# Patient Record
Sex: Female | Born: 1990 | Race: Black or African American | Hispanic: No | Marital: Married | Smoking: Current some day smoker
Health system: Southern US, Community
[De-identification: ages and names within clinical notes are randomized; demographics above are authoritative.]

## PROBLEM LIST (undated history)

## (undated) DIAGNOSIS — A749 Chlamydial infection, unspecified: Secondary | ICD-10-CM

## (undated) DIAGNOSIS — E282 Polycystic ovarian syndrome: Secondary | ICD-10-CM

## (undated) DIAGNOSIS — F419 Anxiety disorder, unspecified: Secondary | ICD-10-CM

## (undated) DIAGNOSIS — IMO0002 Reserved for concepts with insufficient information to code with codable children: Secondary | ICD-10-CM

## (undated) DIAGNOSIS — N871 Moderate cervical dysplasia: Secondary | ICD-10-CM

## (undated) DIAGNOSIS — F1910 Other psychoactive substance abuse, uncomplicated: Secondary | ICD-10-CM

## (undated) DIAGNOSIS — N979 Female infertility, unspecified: Secondary | ICD-10-CM

## (undated) HISTORY — PX: CERVICAL BIOPSY  W/ LOOP ELECTRODE EXCISION: SUR135

## (undated) HISTORY — PX: FINGER SURGERY: SHX640

## (undated) HISTORY — DX: Female infertility, unspecified: N97.9

## (undated) HISTORY — DX: Other psychoactive substance abuse, uncomplicated: F19.10

## (undated) HISTORY — DX: Moderate cervical dysplasia: N87.1

## (undated) HISTORY — DX: Reserved for concepts with insufficient information to code with codable children: IMO0002

## (undated) HISTORY — DX: Polycystic ovarian syndrome: E28.2

## (undated) HISTORY — DX: Anxiety disorder, unspecified: F41.9

## (undated) HISTORY — PX: THERAPEUTIC ABORTION: SHX798

## (undated) HISTORY — DX: Chlamydial infection, unspecified: A74.9

---
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
## 2009-01-04 ENCOUNTER — Ambulatory Visit: Payer: Self-pay | Admitting: Gynecology

## 2009-12-28 ENCOUNTER — Ambulatory Visit: Payer: Self-pay | Admitting: Gynecology

## 2009-12-28 ENCOUNTER — Other Ambulatory Visit: Admission: RE | Admit: 2009-12-28 | Discharge: 2009-12-28 | Payer: Self-pay | Admitting: Gynecology

## 2010-01-25 ENCOUNTER — Ambulatory Visit: Payer: Self-pay | Admitting: Gynecology

## 2010-02-22 ENCOUNTER — Ambulatory Visit: Payer: Self-pay | Admitting: Gynecology

## 2010-03-13 ENCOUNTER — Ambulatory Visit: Payer: Self-pay | Admitting: Gynecology

## 2010-03-16 ENCOUNTER — Ambulatory Visit: Payer: Self-pay | Admitting: Gynecology

## 2010-03-16 ENCOUNTER — Ambulatory Visit (HOSPITAL_BASED_OUTPATIENT_CLINIC_OR_DEPARTMENT_OTHER): Admission: RE | Admit: 2010-03-16 | Discharge: 2010-03-16 | Payer: Self-pay | Admitting: Gynecology

## 2010-03-30 ENCOUNTER — Ambulatory Visit: Payer: Self-pay | Admitting: Gynecology

## 2010-09-22 ENCOUNTER — Emergency Department (HOSPITAL_COMMUNITY)
Admission: EM | Admit: 2010-09-22 | Discharge: 2010-09-22 | Disposition: A | Discharge status: Home / Self Care | Payer: Self-pay | Attending: Emergency Medicine | Admitting: Emergency Medicine

## 2010-09-22 DIAGNOSIS — R109 Unspecified abdominal pain: Secondary | ICD-10-CM | POA: Insufficient documentation

## 2010-09-22 DIAGNOSIS — N390 Urinary tract infection, site not specified: Secondary | ICD-10-CM | POA: Insufficient documentation

## 2010-09-22 DIAGNOSIS — R300 Dysuria: Secondary | ICD-10-CM | POA: Insufficient documentation

## 2010-09-22 LAB — URINALYSIS, ROUTINE W REFLEX MICROSCOPIC
Bilirubin Urine: NEGATIVE
Nitrite: NEGATIVE
Protein, ur: >300 mg/dL — AB
Specific Gravity, Urine: 1.03 (ref 1.005–1.030)
Urine Glucose, Fasting: NEGATIVE mg/dL
Urobilinogen, UA: 1 mg/dL (ref 0.0–1.0)
pH: 7 (ref 5.0–8.0)

## 2010-09-22 LAB — URINE MICROSCOPIC-ADD ON

## 2010-09-22 LAB — POCT PREGNANCY, URINE: Preg Test, Ur: NEGATIVE

## 2011-03-20 ENCOUNTER — Ambulatory Visit (INDEPENDENT_AMBULATORY_CARE_PROVIDER_SITE_OTHER): Payer: BC Managed Care – PPO | Admitting: Women's Health

## 2011-03-20 VITALS — BP 110/64

## 2011-03-20 DIAGNOSIS — Z23 Encounter for immunization: Secondary | ICD-10-CM

## 2011-03-20 DIAGNOSIS — B373 Candidiasis of vulva and vagina: Secondary | ICD-10-CM

## 2011-03-20 DIAGNOSIS — A599 Trichomoniasis, unspecified: Secondary | ICD-10-CM

## 2011-03-20 DIAGNOSIS — N898 Other specified noninflammatory disorders of vagina: Secondary | ICD-10-CM

## 2011-03-20 DIAGNOSIS — Z113 Encounter for screening for infections with a predominantly sexual mode of transmission: Secondary | ICD-10-CM

## 2011-03-20 MED ORDER — TINIDAZOLE 500 MG PO TABS: 2.0000 g | ORAL_TABLET | Freq: Once | ORAL | Status: AC

## 2011-03-20 MED ORDER — FLUCONAZOLE 150 MG PO TABS: 150.0000 mg | ORAL_TABLET | Freq: Once | ORAL | Status: AC

## 2011-03-20 NOTE — Progress Notes (Signed)
  Presents with a complaint of vaginal discharge with burning and itching. Used over-the-counter Monistat without relief. She is due for an annual exam and instructed her to schedule with Dr. Lily Peer. She has had a breakup with her boyfriend, states he  probably was unfaithful. She does use condoms for contraception. Other contraception was reviewed declines at this time will continue with condoms.  External genitalia is erythematous with a milky discharge. Speculum exam moderate amount of  White  adherent discharge with odor was noted, vaginal walls were also erythematous. GC chlamydia culture was taken and is pending. Wet prep was taken positive for yeast and Trichomonas. Bimanual no adnexal fullness or tenderness, discomfort only in vaginal area.  Will treat with Tindamax 2 g by mouth x1 dose, alcohol precautions were reviewed. Diflucan 150 by mouth x1 dose with a refill was given. Instructed to inform her ex partner so that he can be treated as well. Reviewed importance of condoms for infection control. Reviewed will check an HIV hepatitis and RPR at her annual exam.  Gardasil information was reviewed. The  first given today,  return in 2 months and 4 months,  reviewed it is a 3 series vaccination.

## 2011-03-21 ENCOUNTER — Telehealth: Payer: Self-pay | Admitting: Women's Health

## 2011-03-21 DIAGNOSIS — A749 Chlamydial infection, unspecified: Secondary | ICD-10-CM

## 2011-03-21 MED ORDER — AZITHROMYCIN 500 MG PO TABS: 1000.0000 mg | ORAL_TABLET | Freq: Every day | ORAL | Status: AC

## 2011-03-21 NOTE — Telephone Encounter (Signed)
Left message on cell to call office.

## 2011-03-21 NOTE — Telephone Encounter (Signed)
Telephone call to inform positive Chlamydia on culture. Instructed to inform partner for treatment, abstain, will treat with Zithromax 1 g for 1 dose. Will call into her pharmacy, instructed to schedule a three-week followup and is dunual exam also.e for her annual.

## 2011-03-25 DIAGNOSIS — N871 Moderate cervical dysplasia: Secondary | ICD-10-CM | POA: Insufficient documentation

## 2011-03-25 DIAGNOSIS — IMO0002 Reserved for concepts with insufficient information to code with codable children: Secondary | ICD-10-CM | POA: Insufficient documentation

## 2011-03-27 ENCOUNTER — Encounter: Payer: BC Managed Care – PPO | Admitting: Women's Health

## 2011-04-03 ENCOUNTER — Encounter: Payer: BC Managed Care – PPO | Admitting: Women's Health

## 2011-04-11 ENCOUNTER — Encounter: Payer: Self-pay | Admitting: Women's Health

## 2011-04-11 ENCOUNTER — Ambulatory Visit (INDEPENDENT_AMBULATORY_CARE_PROVIDER_SITE_OTHER): Payer: BC Managed Care – PPO | Admitting: Women's Health

## 2011-04-11 VITALS — BP 110/70

## 2011-04-11 DIAGNOSIS — N898 Other specified noninflammatory disorders of vagina: Secondary | ICD-10-CM

## 2011-04-11 DIAGNOSIS — B373 Candidiasis of vulva and vagina: Secondary | ICD-10-CM

## 2011-04-11 DIAGNOSIS — N926 Irregular menstruation, unspecified: Secondary | ICD-10-CM

## 2011-04-11 LAB — POCT URINE PREGNANCY: Preg Test, Ur: POSITIVE

## 2011-04-11 MED ORDER — FLUCONAZOLE 150 MG PO TABS: 150.0000 mg | ORAL_TABLET | Freq: Once | ORAL | Status: AC

## 2011-04-11 MED ORDER — AZITHROMYCIN 1 G PO PACK: 1.0000 | PACK | Freq: Once | ORAL | Status: AC

## 2011-04-11 NOTE — Progress Notes (Signed)
  Presents with a positive U PT, LMP 02/09/2011, which puts her at 8-5/[redacted] weeks gestation. She is not pleased with the pregnancy, is planning to terminate. She's had no bleeding or cramping. She had positive Chlamydia and trichomonas at  August 15 appointment. She took the Flagyl, but did not take the Zithromax. Did review importance of taking the Zithromax, sent the order back to the pharmacy to make sure that would be there for her to take.  Also states is having some increased discharge. External genitalia is slightly erythematous, speculum exam moderate amount of white discharge was noted no odor. Wet prep is positive for yeast. Bimanual uterus palpates about 8 weeks size, no adnexal tenderness, no CMT.  Plan; Diflucan 150 by mouth x1 dose, instructed to take the Zithromax, is planning to terminate, did review if she plans to continue the pregnancy to return to the office for a viability ultrasound. Instructed to return to the office in 3 weeks for an annual exam with Pap smear and test of cure Chlamydia.

## 2011-05-24 ENCOUNTER — Telehealth: Payer: Self-pay | Admitting: *Deleted

## 2011-05-24 NOTE — Telephone Encounter (Signed)
Pt called wanting refill on Diflucan and I left her a message on her VM stating no Rx over phone. Will need OV for AEX and TOC per [place]

## 2011-06-05 ENCOUNTER — Encounter: Payer: BC Managed Care – PPO | Admitting: Women's Health

## 2011-06-13 ENCOUNTER — Encounter: Payer: Self-pay | Admitting: Women's Health

## 2011-06-13 ENCOUNTER — Ambulatory Visit (INDEPENDENT_AMBULATORY_CARE_PROVIDER_SITE_OTHER): Payer: BC Managed Care – PPO | Admitting: Women's Health

## 2011-06-13 ENCOUNTER — Other Ambulatory Visit (HOSPITAL_COMMUNITY)
Admission: RE | Admit: 2011-06-13 | Discharge: 2011-06-13 | Disposition: A | Discharge status: Home / Self Care | Payer: BC Managed Care – PPO | Source: Ambulatory Visit | Attending: Women's Health | Admitting: Women's Health

## 2011-06-13 VITALS — BP 112/70 | Ht 62.0 in | Wt 172.0 lb

## 2011-06-13 DIAGNOSIS — Z01419 Encounter for gynecological examination (general) (routine) without abnormal findings: Secondary | ICD-10-CM

## 2011-06-13 DIAGNOSIS — N898 Other specified noninflammatory disorders of vagina: Secondary | ICD-10-CM

## 2011-06-13 DIAGNOSIS — Z23 Encounter for immunization: Secondary | ICD-10-CM

## 2011-06-13 DIAGNOSIS — Z113 Encounter for screening for infections with a predominantly sexual mode of transmission: Secondary | ICD-10-CM

## 2011-06-13 DIAGNOSIS — B373 Candidiasis of vulva and vagina: Secondary | ICD-10-CM

## 2011-06-13 LAB — HEPATITIS C ANTIBODY: HCV Ab: NEGATIVE

## 2011-06-13 LAB — HIV ANTIBODY (ROUTINE TESTING W REFLEX): HIV: NONREACTIVE

## 2011-06-13 MED ORDER — FLUCONAZOLE 150 MG PO TABS: 150.0000 mg | ORAL_TABLET | Freq: Once | ORAL | Status: AC

## 2011-06-13 MED ORDER — METRONIDAZOLE 0.75 % VA GEL: VAGINAL | Status: AC

## 2011-06-13 NOTE — Progress Notes (Signed)
MALYA CIRILLO Mar 04, 1991 [id number]    History:    The patient presents for annual exam.    Past medical history, past surgical history, family history and social history were all reviewed and documented in the EPIC chart.   ROS:  A  ROS was performed and pertinent positives and negatives are included in the history.  Exam:  Filed Vitals:   06/13/11 1048  BP: 112/70    General appearance:  Normal Head/Neck:  Normal, without cervical or supraclavicular adenopathy. Thyroid:  Symmetrical, normal in size, without palpable masses or nodularity. Respiratory  Effort:  Normal  Auscultation:  Clear without wheezing or rhonchi Cardiovascular  Auscultation:  Regular rate, without rubs, murmurs or gallops  Edema/varicosities:  Not grossly evident Abdominal  Soft,nontender, without masses, guarding or rebound.  Liver/spleen:  No organomegaly noted  Hernia:  None appreciated  Skin  Inspection:  Grossly normal  Palpation:  Grossly normal Neurologic/psychiatric  Orientation:  Normal with appropriate conversation.  Mood/affect:  Normal  Genitourinary    Breasts: Examined lying and sitting.     Right: Without masses, retractions, discharge or axillary adenopathy.     Left: Without masses, retractions, discharge or axillary adenopathy.   Inguinal/mons:  Normal without inguinal adenopathy  External genitalia:  Normal  BUS/Urethra/Skene's glands:  Normal  Bladder:  Normal  Vagina:  Normal  Cervix:  Normal  Uterus:   normal in size, shape and contour.  Midline and mobile  Adnexa/parametria:     Rt: Without masses or tenderness.   Lt: Without masses or tenderness.  Anus and perineum: Normal  Digital rectal exam: Normal sphincter tone without palpated masses or tenderness  Assessment/Plan:  20 y.o.SBF G1P0  for annual exam. Had a termination less than one month ago. Was positive for Chlamydia in August, did not take the Zithromax as prescribed, did take doxycycline with  termination.  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 Diflucan 150 by mouth x1 dose, prescription proper use reviewed for both prescriptions. Contraception reviewed. States feels like she is getting ready to start her cycle, has not been sexually active, options reviewed, will try nexplanon , Dr. Lily Peer to place with next cycle. Will start on Generess (sample pack  given) first day of next cycle take daily until Nexplanon can be placed. Encouraged abstinence,  condoms if active. Handout, information reviewed about nexplanon. SBEs, exercise, MVI daily encouraged. Dating safety reviewed, abstinence encouraged. CBC, Pap, GC/Chlamydia, HIV, RPR, hepatitis B and C.   Harrington Challenger [facility], 12:41 PM 06/13/2011

## 2011-06-24 ENCOUNTER — Encounter: Payer: Self-pay | Admitting: *Deleted

## 2011-06-24 NOTE — Progress Notes (Signed)
  Checked benefits for Nexplanon for patient.  Insurance claims her name is different from what they have in their system.  We can not perform procedure till this is situated.  Once it is settled she would have a $20 copay.  Patient is to work on getting this straight and then call us when done so we can schedule insert.

## 2011-09-23 ENCOUNTER — Ambulatory Visit: Payer: BC Managed Care – PPO

## 2011-10-25 ENCOUNTER — Other Ambulatory Visit: Payer: Self-pay | Admitting: Women's Health

## 2012-01-31 ENCOUNTER — Encounter: Payer: Self-pay | Admitting: Women's Health

## 2012-01-31 ENCOUNTER — Telehealth: Payer: Self-pay | Admitting: Women's Health

## 2012-01-31 ENCOUNTER — Ambulatory Visit (INDEPENDENT_AMBULATORY_CARE_PROVIDER_SITE_OTHER): Payer: BC Managed Care – PPO | Admitting: Women's Health

## 2012-01-31 DIAGNOSIS — N912 Amenorrhea, unspecified: Secondary | ICD-10-CM

## 2012-01-31 DIAGNOSIS — IMO0001 Reserved for inherently not codable concepts without codable children: Secondary | ICD-10-CM

## 2012-01-31 DIAGNOSIS — Z309 Encounter for contraceptive management, unspecified: Secondary | ICD-10-CM

## 2012-01-31 DIAGNOSIS — N898 Other specified noninflammatory disorders of vagina: Secondary | ICD-10-CM

## 2012-01-31 LAB — HCG, SERUM, QUALITATIVE: Preg, Serum: NEGATIVE

## 2012-01-31 LAB — WET PREP FOR TRICH, YEAST, CLUE: Yeast Wet Prep HPF POC: NONE SEEN

## 2012-01-31 MED ORDER — METRONIDAZOLE 0.75 % VA GEL: VAGINAL | Status: AC

## 2012-01-31 MED ORDER — FLUCONAZOLE 150 MG PO TABS: 150.0000 mg | ORAL_TABLET | Freq: Once | ORAL | Status: AC

## 2012-01-31 MED ORDER — ETONOGESTREL-ETHINYL ESTRADIOL 0.12-0.015 MG/24HR VA RING: 1.0000 | VAGINAL_RING | VAGINAL | Status: DC

## 2012-01-31 MED ORDER — MEDROXYPROGESTERONE ACETATE 10 MG PO TABS: 10.0000 mg | ORAL_TABLET | Freq: Every day | ORAL | Status: DC

## 2012-01-31 NOTE — Patient Instructions (Addendum)
Bacterial Vaginosis Bacterial vaginosis (BV) is a vaginal infection where the normal balance of bacteria in the vagina is disrupted. The normal balance is then replaced by an overgrowth of certain bacteria. There are several different kinds of bacteria that can cause BV. BV is the most common vaginal infection in women of childbearing age. CAUSES   The cause of BV is not fully understood. BV develops when there is an increase or imbalance of harmful bacteria.   Some activities or behaviors can upset the normal balance of bacteria in the vagina and put women at increased risk including:   Having a new sex partner or multiple sex partners.   Douching.   Using an intrauterine device (IUD) for contraception.   It is not clear what role sexual activity plays in the development of BV. However, women that have never had sexual intercourse are rarely infected with BV.  Women do not get BV from toilet seats, bedding, swimming pools or from touching objects around them.  SYMPTOMS   Grey vaginal discharge.   A fish-like odor with discharge, especially after sexual intercourse.   Itching or burning of the vagina and vulva.   Burning or pain with urination.   Some women have no signs or symptoms at all.  DIAGNOSIS  Your caregiver must examine the vagina for signs of BV. Your caregiver will perform lab tests and look at the sample of vaginal fluid through a microscope. They will look for bacteria and abnormal cells (clue cells), a pH test higher than 4.5, and a positive amine test all associated with BV.  RISKS AND COMPLICATIONS   Pelvic inflammatory disease (PID).   Infections following gynecology surgery.   Developing HIV.   Developing herpes virus.  TREATMENT  Sometimes BV will clear up without treatment. However, all women with symptoms of BV should be treated to avoid complications, especially if gynecology surgery is planned. Female partners generally do not need to be treated. However,  BV may spread between female sex partners so treatment is helpful in preventing a recurrence of BV.   BV may be treated with antibiotics. The antibiotics come in either pill or vaginal cream forms. Either can be used with nonpregnant or pregnant women, but the recommended dosages differ. These antibiotics are not harmful to the baby.   BV can recur after treatment. If this happens, a second round of antibiotics will often be prescribed.   Treatment is important for pregnant women. If not treated, BV can cause a premature delivery, especially for a pregnant woman who had a premature birth in the past. All pregnant women who have symptoms of BV should be checked and treated.   For chronic reoccurrence of BV, treatment with a type of prescribed gel vaginally twice a week is helpful.  HOME CARE INSTRUCTIONS   Finish all medication as directed by your caregiver.   Do not have sex until treatment is completed.   Tell your sexual partner that you have a vaginal infection. They should see their caregiver and be treated if they have problems, such as a mild rash or itching.   Practice safe sex. Use condoms. Only have 1 sex partner.  PREVENTION  Basic prevention steps can help reduce the risk of upsetting the natural balance of bacteria in the vagina and developing BV:  Do not have sexual intercourse (be abstinent).   Do not douche.   Use all of the medicine prescribed for treatment of BV, even if the signs and symptoms go away.     Tell your sex partner if you have BV. That way, they can be treated, if needed, to prevent reoccurrence.  SEEK MEDICAL CARE IF:   Your symptoms are not improving after 3 days of treatment.   You have increased discharge, pain, or fever.  MAKE SURE YOU:   Understand these instructions.   Will watch your condition.   Will get help right away if you are not doing well or get worse.  FOR MORE INFORMATION  Division of STD Prevention (DSTDP), Centers for Disease  Control and Prevention: www.cdc.gov/std American Social Health Association (ASHA): www.ashastd.org  Document Released: 07/22/2005 Document Revised: 07/11/2011 Document Reviewed: 01/12/2009 ExitCare Patient Information 2012 [employer]. 

## 2012-01-31 NOTE — Telephone Encounter (Signed)
Telephone call, informed blood pregnancy test result not back will call first thing Monday morning with results.

## 2012-01-31 NOTE — Progress Notes (Signed)
Presents for amenorrhea since beginning of April. Unsure of exact date of LMP. Monthly cycles prior to April.  UPT/STD testing 1 month ago-negative. Unprotected sex X 1 since negative UPT. Same partner X 1 year/occassional condoms/desiring nexplanon. Reports diagnosed with BV one week ago, has not taken prescription.   Exam: External genitalia erythematous. Speculum exam: White discharge adherent to vaginal walls/no odor noted.  Bimanual: No cervical motion or adnexal tenderness. Wet prep positive for amines, clues, and bacteria.  Amenorrhea BV Contraception counseling  Plan: hcG qual.  Provera 10 mg X 5 days, prescription, instructed not to take until notified of hCG results. Instructed to notify if no cycle in 2 weeks or if continued irregular cycles. Metrogel vaginal  gel1 applicator X 5 days, prescription, use reviewed. Nuva Ring sample and prescription, will tryl until nexplanon coverage obtained. Encouraged condoms for first month on Nuva Ring for pregnancy prevention and continued condoms for infection control. Instructed on slight risk for blood clots/strokes with Nuva ring. Instructed to call office with next cycle to schedule nexplanon insertion with Dr. Lily Peer.

## 2012-01-31 NOTE — Telephone Encounter (Signed)
Left message for patient to call me regarding ins benefits for Nexplanon.  Her plan will cover the device and insertion 100% and the removal with a $20 copymt and then 100%.

## 2012-01-31 NOTE — Telephone Encounter (Signed)
Patient called back. I let her know benefits. She is awaiting qualitative serum pregnancy test results. If negative and she wants to proceed I gave her my number to call me back and let me know so we can order her one and arrange.

## 2012-02-03 NOTE — Telephone Encounter (Signed)
Informed by telephone on 6/28 blood pregnancy test negative instructed to take Provera and call if no cycle in 2 weeks.

## 2012-02-04 NOTE — Telephone Encounter (Signed)
I called patient to follow-up and see if she wanted Amy to order Nexplanon.  She said she wants to think about it. I told her just to call when she is ready and we will order for her.

## 2012-02-18 ENCOUNTER — Telehealth: Payer: Self-pay | Admitting: *Deleted

## 2012-02-18 NOTE — Telephone Encounter (Signed)
(  pt aware you out of the office) Pt was given provera x 5 days to start cycle on 01/31/12, pt took all Rx and no cycle in 2 weeks as noted in office note. Pt is calling to follow up. Please advise

## 2012-02-19 NOTE — Telephone Encounter (Signed)
Please call patient--needs office visit.

## 2012-02-19 NOTE — Telephone Encounter (Signed)
Pt informed with the below note, pt said she will call back to make ov.

## 2012-02-20 ENCOUNTER — Ambulatory Visit (INDEPENDENT_AMBULATORY_CARE_PROVIDER_SITE_OTHER): Payer: BC Managed Care – PPO | Admitting: Women's Health

## 2012-02-20 ENCOUNTER — Encounter: Payer: Self-pay | Admitting: Women's Health

## 2012-02-20 DIAGNOSIS — B379 Candidiasis, unspecified: Secondary | ICD-10-CM

## 2012-02-20 DIAGNOSIS — B49 Unspecified mycosis: Secondary | ICD-10-CM

## 2012-02-20 DIAGNOSIS — N912 Amenorrhea, unspecified: Secondary | ICD-10-CM

## 2012-02-20 LAB — WET PREP FOR TRICH, YEAST, CLUE

## 2012-02-20 MED ORDER — FLUCONAZOLE 150 MG PO TABS: 150.0000 mg | ORAL_TABLET | Freq: Once | ORAL | Status: AC

## 2012-02-20 NOTE — Progress Notes (Signed)
Patient ID: Laura Huang, female   DOB: 1990/10/22, 22 y.o.   MRN: [id number] Presents with amenorrhea after Provera 10 mg for 5 days. Was seen in the office 2 weeks ago had a negative hCG qual. I was given Provera. Had monthly regular. Had been using condoms but not consistently.  Exam: Abdomen soft nontender external genitalia erythematous at introitus. Speculum exam moderate amount of a white discharge was noted wet prep was positive for rare yeast. Bimanual uterus small nontender no adnexal fullness or tenderness.  Secondary amenorrhea Yeast  Plan: TSH, prolactin, hCG qualitative. If all normal ultrasound. Diflucan 150 by mouth x1 dose.

## 2012-02-20 NOTE — Patient Instructions (Addendum)
Secondary Amenorrhea  Secondary amenorrhea is the stopping of menstrual flow for 3 to 6 months in a female who has previously had periods. There are many possible causes. Most of these causes are not serious. Usually treating the underlying problem causing the loss of menses will return your periods to normal. CAUSES  Some common and uncommon causes of not menstruating include:  Malnutrition.   Low blood sugar (hypoglycemia).   Polycystic ovarian disease.   Stress or fear.   Breastfeeding.   Hormone imbalance.   Ovarian failure.   Medications.   Extreme obesity.   Cystic fibrosis.   Low body weight or drastic weight reduction from any cause.   Early menopause.   Removal of ovaries or uterus.   Contraceptives.   Illness.   Long term (chronic) illnesses.   Cushing's syndrome.   Thyroid problems.   Birth control pills, patches, or vaginal rings for birth control.  DIAGNOSIS  This diagnosis is made by your caregiver taking a medical history and doing a physical exam. Pregnancy must be ruled out. Often times, numerous blood tests of different hormones in the body may be measured. Urine testing may be done. Specialized x-rays may have to be done as well as measuring the body mass index (BMI). TREATMENT  Treatment depends on the cause of the amenorrhea. If an eating disorder is present, this can be treated with an adequate diet and therapy. Chronic illnesses may improve with treatment of the illness. Overall, the outlook is good. The amenorrhea may be corrected with medications, lifestyle changes, or surgery. If the amenorrhea cannot be corrected, it is sometimes possible to create a false menstruation with medications. Document Released: 09/02/2006 Document Revised: 07/11/2011 Document Reviewed: 07/10/2007 ExitCare Patient Information 2012 [employer]. 

## 2012-02-21 LAB — HCG, QUANTITATIVE, PREGNANCY: hCG, Beta Chain, Quant, S: <1 m[IU]/mL

## 2012-02-27 ENCOUNTER — Other Ambulatory Visit: Payer: BC Managed Care – PPO

## 2012-02-27 ENCOUNTER — Ambulatory Visit: Payer: BC Managed Care – PPO | Admitting: Women's Health

## 2012-02-28 ENCOUNTER — Other Ambulatory Visit: Payer: BC Managed Care – PPO

## 2012-02-28 ENCOUNTER — Ambulatory Visit: Payer: BC Managed Care – PPO | Admitting: Women's Health

## 2012-03-02 ENCOUNTER — Other Ambulatory Visit: Payer: BC Managed Care – PPO

## 2012-03-02 ENCOUNTER — Ambulatory Visit: Payer: BC Managed Care – PPO | Admitting: Women's Health

## 2012-04-15 ENCOUNTER — Other Ambulatory Visit: Payer: Self-pay | Admitting: Women's Health

## 2012-06-11 ENCOUNTER — Ambulatory Visit: Payer: BC Managed Care – PPO | Admitting: Women's Health

## 2013-12-04 ENCOUNTER — Other Ambulatory Visit: Payer: Self-pay | Admitting: Women's Health

## 2014-01-04 ENCOUNTER — Encounter: Payer: Self-pay | Admitting: Women's Health

## 2014-01-04 ENCOUNTER — Ambulatory Visit (INDEPENDENT_AMBULATORY_CARE_PROVIDER_SITE_OTHER): Payer: BC Managed Care – PPO | Admitting: Women's Health

## 2014-01-04 DIAGNOSIS — N938 Other specified abnormal uterine and vaginal bleeding: Secondary | ICD-10-CM

## 2014-01-04 DIAGNOSIS — N925 Other specified irregular menstruation: Secondary | ICD-10-CM

## 2014-01-04 DIAGNOSIS — IMO0001 Reserved for inherently not codable concepts without codable children: Secondary | ICD-10-CM

## 2014-01-04 DIAGNOSIS — N949 Unspecified condition associated with female genital organs and menstrual cycle: Secondary | ICD-10-CM

## 2014-01-04 DIAGNOSIS — Z309 Encounter for contraceptive management, unspecified: Secondary | ICD-10-CM

## 2014-01-04 LAB — WET PREP FOR TRICH, YEAST, CLUE
Clue Cells Wet Prep HPF POC: NONE SEEN
Trich, Wet Prep: NONE SEEN
WBC, Wet Prep HPF POC: NONE SEEN
YEAST WET PREP: NONE SEEN

## 2014-01-04 MED ORDER — IBUPROFEN 600 MG PO TABS: 600.0000 mg | ORAL_TABLET | Freq: Three times a day (TID) | ORAL | Status: DC | PRN

## 2014-01-04 MED ORDER — MEDROXYPROGESTERONE ACETATE 10 MG PO TABS: 10.0000 mg | ORAL_TABLET | Freq: Every day | ORAL | Status: DC

## 2014-01-04 MED ORDER — NORGESTREL-ETHINYL ESTRADIOL 0.3-30 MG-MCG PO TABS: 1.0000 | ORAL_TABLET | Freq: Every day | ORAL | Status: DC

## 2014-01-04 NOTE — Progress Notes (Signed)
Patient ID: Laura Huang, female   DOB: 1990/11/14, 23 y.o.   MRN: [id number] Has not been seen in the office for 2 years, working in [place]. Presents with low abdominal pain, heavy menstrual cycle with cramping. Had been put on birth control pills for cycle regulation history of irregular cycles with periods of amenorrhea.  2 day cycle in January on pills, stopped pills, month of April bled daily, May 31 low abdominal cramping with heavy bleeding started. Using condoms and withdrawal or nothing for contraception. Same partner. Denies urinary symptoms, vaginal discharge.  Exam: Appears well. External genitalia within normal limits, speculum exam copious menstrual type blood wet prep negative, GC/Chlamydia culture taken, bimanual slight tenderness with exam uterus small.  Menorrhagia  Plan: HCG qualitative, TSH, prolactin. Discussed taking Provera 10 for 10 days if call negative and bleeding does not stop by 7 days. Reviewed starting back on pills, prescription, proper use, slight risk for blood clots and strokes reviewed. Condoms first month back on. Instructed to call if no relief, schedule annual exam.

## 2014-01-05 LAB — PROLACTIN: PROLACTIN: 9.8 ng/mL

## 2014-01-05 LAB — GC/CHLAMYDIA PROBE AMP
CT PROBE, AMP APTIMA: NEGATIVE
GC Probe RNA: NEGATIVE

## 2014-01-05 LAB — HCG, SERUM, QUALITATIVE: Preg, Serum: NEGATIVE

## 2014-01-05 LAB — TSH: TSH: 3.579 u[IU]/mL (ref 0.350–4.500)

## 2014-01-07 ENCOUNTER — Telehealth: Payer: Self-pay

## 2014-01-07 ENCOUNTER — Encounter: Payer: Self-pay | Admitting: Women's Health

## 2014-01-07 NOTE — Telephone Encounter (Signed)
Patient informed of below. Bleeding has already stopped. She asked when she should start the birth control pill Rx you gave her?

## 2014-01-07 NOTE — Telephone Encounter (Signed)
Message copied by Keenan Bachelor on Fri Jan 07, 2014  2:51 PM ------      Message from: [place], [place] J      Created: Wed Jan 05, 2014  3:47 PM       Please call and review all labs were normal, blood pregnancy test negative, if bleeding does not stop/slow by Saturday start Provera and call if that does not stop bleeding. ------

## 2014-01-07 NOTE — Telephone Encounter (Signed)
Start Sunday and take daily

## 2014-01-07 NOTE — Telephone Encounter (Signed)
Left detailed message voice mail and told her to call me back if any questions.

## 2014-01-21 ENCOUNTER — Encounter: Payer: Self-pay | Admitting: Women's Health

## 2014-02-08 ENCOUNTER — Ambulatory Visit (INDEPENDENT_AMBULATORY_CARE_PROVIDER_SITE_OTHER): Payer: BC Managed Care – PPO | Admitting: Women's Health

## 2014-02-08 ENCOUNTER — Other Ambulatory Visit (HOSPITAL_COMMUNITY)
Admission: RE | Admit: 2014-02-08 | Discharge: 2014-02-08 | Disposition: A | Discharge status: Home / Self Care | Payer: BC Managed Care – PPO | Source: Ambulatory Visit | Attending: Gynecology | Admitting: Gynecology

## 2014-02-08 ENCOUNTER — Encounter: Payer: Self-pay | Admitting: Women's Health

## 2014-02-08 VITALS — BP 118/72 | Ht 63.0 in | Wt 204.4 lb

## 2014-02-08 DIAGNOSIS — Z01419 Encounter for gynecological examination (general) (routine) without abnormal findings: Secondary | ICD-10-CM

## 2014-02-08 DIAGNOSIS — Z3041 Encounter for surveillance of contraceptive pills: Secondary | ICD-10-CM

## 2014-02-08 DIAGNOSIS — Z23 Encounter for immunization: Secondary | ICD-10-CM

## 2014-02-08 DIAGNOSIS — Z113 Encounter for screening for infections with a predominantly sexual mode of transmission: Secondary | ICD-10-CM

## 2014-02-08 DIAGNOSIS — N871 Moderate cervical dysplasia: Secondary | ICD-10-CM

## 2014-02-08 LAB — CBC WITH DIFFERENTIAL/PLATELET
BASOS ABS: 0 10*3/uL (ref 0.0–0.1)
BASOS PCT: 0 % (ref 0–1)
EOS ABS: 0.1 10*3/uL (ref 0.0–0.7)
EOS PCT: 1 % (ref 0–5)
HEMATOCRIT: 38.1 % (ref 36.0–46.0)
Hemoglobin: 13 g/dL (ref 12.0–15.0)
Lymphocytes Relative: 42 % (ref 12–46)
Lymphs Abs: 2.7 10*3/uL (ref 0.7–4.0)
MCH: 29.1 pg (ref 26.0–34.0)
MCHC: 34.1 g/dL (ref 30.0–36.0)
MCV: 85.2 fL (ref 78.0–100.0)
MONO ABS: 0.5 10*3/uL (ref 0.1–1.0)
Monocytes Relative: 8 % (ref 3–12)
Neutro Abs: 3.2 10*3/uL (ref 1.7–7.7)
Neutrophils Relative %: 49 % (ref 43–77)
Platelets: 284 10*3/uL (ref 150–400)
RBC: 4.47 MIL/uL (ref 3.87–5.11)
RDW: 15.3 % (ref 11.5–15.5)
WBC: 6.5 10*3/uL (ref 4.0–10.5)

## 2014-02-08 MED ORDER — NORGESTREL-ETHINYL ESTRADIOL 0.3-30 MG-MCG PO TABS: 1.0000 | ORAL_TABLET | Freq: Every day | ORAL | Status: DC

## 2014-02-08 NOTE — Progress Notes (Signed)
Laura BruinsCarlise T Huang [birth year] [id number]    History:    Presents for annual exam.  Has had problems with irregular cycles in the past, and doing better on Lo/Ovral. History of CIN-2, normal pap 2012.  Chlamydia 2012 negative  GC/Chlamydia 01/2014. Received 2 gardasils in the past. [place] in [place], has had care there, reports normal paps.  Past medical history, past surgical history, family history and social history were all reviewed and documented in the EPIC chart. 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  ROS:  A  12 point ROS was performed and pertinent positives and negatives are included.  Exam:  Filed Vitals:   02/08/14 1201  BP: 118/72    General appearance:  Normal Thyroid:  Symmetrical, normal in size, without palpable masses or nodularity. Respiratory  Auscultation:  Clear without wheezing or rhonchi Cardiovascular  Auscultation:  Regular rate, without rubs, murmurs or gallops  Edema/varicosities:  Not grossly evident Abdominal  Soft,nontender, without masses, guarding or rebound.  Liver/spleen:  No organomegaly noted  Hernia:  None appreciated  Skin  Inspection:  Grossly normal   Breasts: Examined lying and sitting.     Right: Without masses, retractions, discharge or axillary adenopathy.     Left: Without masses, retractions, discharge or axillary adenopathy. Gentitourinary   Inguinal/mons:  Normal without inguinal adenopathy  External genitalia:  Normal  BUS/Urethra/Skene's glands:  Normal  Vagina:  Normal  Cervix:  Normal  Uterus:   normal in size, shape and contour.  Midline and mobile  Adnexa/parametria:     Rt: Without masses or tenderness.   Lt: Without masses or tenderness.  Anus and perineum: Normal    Assessment/Plan:  23 y.o. SBF G0 for annual exam.     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 Lo ovral prescription, proper use given and reviewed slight risk for blood clots and strokes. Condoms encouraged until permanent partner. SBE's,  exercise, calcium rich diet, MVI daily encouraged. CBC, UA, Pap, HIV, hep B, C., RPR. Decrease calories and increasing exercise for weight loss.   Note: This dictation was prepared with Dragon/digital dictation.  Any transcriptional errors that result are unintentional. Harrington ChallengerYOUNG,Sabino Denning J Ann Klein Forensic CenterWHNP, 6:06 PM 02/08/2014

## 2014-02-08 NOTE — Patient Instructions (Signed)
Health Maintenance, Female A healthy lifestyle and preventative care can promote health and wellness.  Maintain regular health, dental, and eye exams.  Eat a healthy diet. Foods like vegetables, fruits, whole grains, low-fat dairy products, and lean protein foods contain the nutrients you need without too many calories. Decrease your intake of foods high in solid fats, added sugars, and salt. Get information about a proper diet from your caregiver, if necessary.  Regular physical exercise is one of the most important things you can do for your health. Most adults should get at least 150 minutes of moderate-intensity exercise (any activity that increases your heart rate and causes you to sweat) each week. In addition, most adults need muscle-strengthening exercises on 2 or more days a week.   Maintain a healthy weight. The body mass index (BMI) is a screening tool to identify possible weight problems. It provides an estimate of body fat based on height and weight. Your caregiver can help determine your BMI, and can help you achieve or maintain a healthy weight. For adults 20 years and older:  A BMI below 18.5 is considered underweight.  A BMI of 18.5 to 24.9 is normal.  A BMI of 25 to 29.9 is considered overweight.  A BMI of 30 and above is considered obese.  Maintain normal blood lipids and cholesterol by exercising and minimizing your intake of saturated fat. Eat a balanced diet with plenty of fruits and vegetables. Blood tests for lipids and cholesterol should begin at age 41 and be repeated every 5 years. If your lipid or cholesterol levels are high, you are over 50, or you are a high risk for heart disease, you may need your cholesterol levels checked more frequently.Ongoing high lipid and cholesterol levels should be treated with medicines if diet and exercise are not effective.  If you smoke, find out from your caregiver how to quit. If you do not use tobacco, do not start.  Lung  cancer screening is recommended for adults aged 66-80 years who are at high risk for developing lung cancer because of a history of smoking. Yearly low-dose computed tomography (CT) is recommended for people who have at least a 30-pack-year history of smoking and are a current smoker or have quit within the past 15 years. A pack year of smoking is smoking an average of 1 pack of cigarettes a day for 1 year (for example: 1 pack a day for 30 years or 2 packs a day for 15 years). Yearly screening should continue until the smoker has stopped smoking for at least 15 years. Yearly screening should also be stopped for people who develop a health problem that would prevent them from having lung cancer treatment.  If you are pregnant, do not drink alcohol. If you are breastfeeding, be very cautious about drinking alcohol. If you are not pregnant and choose to drink alcohol, do not exceed 1 drink per day. One drink is considered to be 12 ounces (355 mL) of beer, 5 ounces (148 mL) of wine, or 1.5 ounces (44 mL) of liquor.  Avoid use of street drugs. Do not share needles with anyone. Ask for help if you need support or instructions about stopping the use of drugs.  High blood pressure causes heart disease and increases the risk of stroke. Blood pressure should be checked at least every 1 to 2 years. Ongoing high blood pressure should be treated with medicines, if weight loss and exercise are not effective.  If you are 55 to 24  years old, ask your caregiver if you should take aspirin to prevent strokes.  Diabetes screening involves taking a blood sample to check your fasting blood sugar level. This should be done once every 3 years, after age 23, if you are within normal weight and without risk factors for diabetes. Testing should be considered at a younger age or be carried out more frequently if you are overweight and have at least 1 risk factor for diabetes.  Breast cancer screening is essential preventative care  for women. You should practice "breast self-awareness." This means understanding the normal appearance and feel of your breasts and may include breast self-examination. Any changes detected, no matter how small, should be reported to a caregiver. Women in their 57s and 30s should have a clinical breast exam (CBE) by a caregiver as part of a regular health exam every 1 to 3 years. After age 62, women should have a CBE every year. Starting at age 48, women should consider having a mammogram (breast X-ray) every year. Women who have a family history of breast cancer should talk to their caregiver about genetic screening. Women at a high risk of breast cancer should talk to their caregiver about having an MRI and a mammogram every year.  Breast cancer gene (BRCA)-related cancer risk assessment is recommended for women who have family members with BRCA-related cancers. BRCA-related cancers include breast, ovarian, tubal, and peritoneal cancers. Having family members with these cancers may be associated with an increased risk for harmful changes (mutations) in the breast cancer genes BRCA1 and BRCA2. Results of the assessment will determine the need for genetic counseling and BRCA1 and BRCA2 testing.  The Pap test is a screening test for cervical cancer. Women should have a Pap test starting at age 25. Between ages 64 and 32, Pap tests should be repeated every 2 years. Beginning at age 87, you should have a Pap test every 3 years as long as the past 3 Pap tests have been normal. If you had a hysterectomy for a problem that was not cancer or a condition that could lead to cancer, then you no longer need Pap tests. If you are between ages 37 and 57, and you have had normal Pap tests going back 10 years, you no longer need Pap tests. If you have had past treatment for cervical cancer or a condition that could lead to cancer, you need Pap tests and screening for cancer for at least 20 years after your treatment. If Pap  tests have been discontinued, risk factors (such as a new sexual partner) need to be reassessed to determine if screening should be resumed. Some women have medical problems that increase the chance of getting cervical cancer. In these cases, your caregiver may recommend more frequent screening and Pap tests.  The human papillomavirus (HPV) test is an additional test that may be used for cervical cancer screening. The HPV test looks for the virus that can cause the cell changes on the cervix. The cells collected during the Pap test can be tested for HPV. The HPV test could be used to screen women aged 25 years and older, and should be used in women of any age who have unclear Pap test results. After the age of 85, women should have HPV testing at the same frequency as a Pap test.  Colorectal cancer can be detected and often prevented. Most routine colorectal cancer screening begins at the age of 59 and continues through age 70. However, your caregiver may  recommend screening at an earlier age if you have risk factors for colon cancer. On a yearly basis, your caregiver may provide home test kits to check for hidden blood in the stool. Use of a small camera at the end of a tube, to directly examine the colon (sigmoidoscopy or colonoscopy), can detect the earliest forms of colorectal cancer. Talk to your caregiver about this at age 61, when routine screening begins. Direct examination of the colon should be repeated every 5 to 10 years through age 16, unless early forms of pre-cancerous polyps or small growths are found.  Hepatitis C blood testing is recommended for all people born from [birth year] through 1966 and any individual with known risks for hepatitis C.  Practice safe sex. Use condoms and avoid high-risk sexual practices to reduce the spread of sexually transmitted infections (STIs). Sexually active women aged 68 and younger should be checked for Chlamydia, which is a common sexually transmitted infection.  Older women with new or multiple partners should also be tested for Chlamydia. Testing for other STIs is recommended if you are sexually active and at increased risk.  Osteoporosis is a disease in which the bones lose minerals and strength with aging. This can result in serious bone fractures. The risk of osteoporosis can be identified using a bone density scan. Women ages 60 and over and women at risk for fractures or osteoporosis should discuss screening with their caregivers. Ask your caregiver whether you should be taking a calcium supplement or vitamin D to reduce the rate of osteoporosis.  Menopause can be associated with physical symptoms and risks. Hormone replacement therapy is available to decrease symptoms and risks. You should talk to your caregiver about whether hormone replacement therapy is right for you.  Use sunscreen. Apply sunscreen liberally and repeatedly throughout the day. You should seek shade when your shadow is shorter than you. Protect yourself by wearing long sleeves, pants, a wide-brimmed hat, and sunglasses year round, whenever you are outdoors.  Notify your caregiver of new moles or changes in moles, especially if there is a change in shape or color. Also notify your caregiver if a mole is larger than the size of a pencil eraser.  Stay current with your immunizations. Document Released: 02/04/2011 Document Revised: 11/16/2012 Document Reviewed: 06/23/2013 [facility] Patient Information 2015 [place], [place]. This information is not intended to replace advice given to you by your health care provider. Make sure you discuss any questions you have with your health care provider.

## 2014-02-09 LAB — URINALYSIS W MICROSCOPIC + REFLEX CULTURE
BACTERIA UA: NONE SEEN
BILIRUBIN URINE: NEGATIVE
CRYSTALS: NONE SEEN
Casts: NONE SEEN
Glucose, UA: NEGATIVE mg/dL
Hgb urine dipstick: NEGATIVE
KETONES UR: NEGATIVE mg/dL
Leukocytes, UA: NEGATIVE
Nitrite: NEGATIVE
Protein, ur: NEGATIVE mg/dL
Specific Gravity, Urine: 1.026 (ref 1.005–1.030)
UROBILINOGEN UA: 0.2 mg/dL (ref 0.0–1.0)
pH: 7 (ref 5.0–8.0)

## 2014-02-09 LAB — HEPATITIS C ANTIBODY: HCV Ab: NEGATIVE

## 2014-02-09 LAB — HEPATITIS B SURFACE ANTIGEN: Hepatitis B Surface Ag: NEGATIVE

## 2014-02-09 LAB — RPR

## 2014-02-09 LAB — HIV ANTIBODY (ROUTINE TESTING W REFLEX): HIV 1&2 Ab, 4th Generation: NONREACTIVE

## 2014-02-11 ENCOUNTER — Telehealth: Payer: Self-pay

## 2014-02-11 LAB — CYTOLOGY - PAP

## 2014-02-11 NOTE — Telephone Encounter (Signed)
Patient called for pap result. Informed normal pap in 02/2014.

## 2014-05-20 ENCOUNTER — Other Ambulatory Visit: Payer: Self-pay

## 2014-06-06 ENCOUNTER — Encounter: Payer: Self-pay | Admitting: Women's Health

## 2014-11-18 ENCOUNTER — Ambulatory Visit (INDEPENDENT_AMBULATORY_CARE_PROVIDER_SITE_OTHER): Payer: BLUE CROSS/BLUE SHIELD | Admitting: Women's Health

## 2014-11-18 ENCOUNTER — Encounter: Payer: Self-pay | Admitting: Women's Health

## 2014-11-18 VITALS — BP 128/80 | Ht 63.0 in | Wt 188.0 lb

## 2014-11-18 DIAGNOSIS — N912 Amenorrhea, unspecified: Secondary | ICD-10-CM | POA: Diagnosis not present

## 2014-11-18 LAB — PREGNANCY, URINE: PREG TEST UR: NEGATIVE

## 2014-11-18 NOTE — Progress Notes (Signed)
Patient ID: Kristin BruinsCarlise T Pion, female   DOB: Dec 27, 1990, 24 y.o.   MRN: [id number] Presents with amenorrhea. Stopped OCs in March, 2 days late for cycle, desiring conception. History of irregular cycles. Mild intermittent left lower quadrant cramping. Denies vaginal discharge, urinary symptoms, fever. Normal TSH and prolactin, negative STD screen with partner. History of TAB, positive chlamydia 2010. Reports partners health as healthy, no children, no conception when off OCs for approximately one year..  Exam: Appears well. UPT negative.  Amenorrhea  Plan: Ovulation/conceptual timing reviewed. Instructed to call if no cycle in 2 weeks. Prenatal vitamins daily encouraged. Safe pregnancy behaviors reviewed. Aware we no longer deliver. Reviewed next steps if no conception, hysterosalpingogram, semen analysis.

## 2014-12-02 ENCOUNTER — Telehealth: Payer: Self-pay

## 2014-12-02 ENCOUNTER — Other Ambulatory Visit: Payer: Self-pay | Admitting: Women's Health

## 2014-12-02 DIAGNOSIS — N912 Amenorrhea, unspecified: Secondary | ICD-10-CM

## 2014-12-02 NOTE — Telephone Encounter (Signed)
Best to check a blood pregnancy test if negative withdrawal with Provera 10 for 5 days. She had a negative UPT when here 2 weeks ago but was only late on cycle 2 days. She is desiring conception, history of irregular periods and amenorrhea. She can come for only a lab appointment have blood drawn and leave and I will call her with results.

## 2014-12-02 NOTE — Telephone Encounter (Signed)
Patient informed. She will come Monday for Qual PT. I did not send Rx yet since we need to be sure PT neg.

## 2014-12-02 NOTE — Telephone Encounter (Signed)
Patient was to report to you if she did not have period for two weeks since last visit. She is calling to report no menses.

## 2014-12-05 ENCOUNTER — Other Ambulatory Visit: Payer: BLUE CROSS/BLUE SHIELD

## 2014-12-05 DIAGNOSIS — N912 Amenorrhea, unspecified: Secondary | ICD-10-CM

## 2014-12-06 ENCOUNTER — Other Ambulatory Visit: Payer: Self-pay | Admitting: *Deleted

## 2014-12-06 LAB — HCG, SERUM, QUALITATIVE: Preg, Serum: NEGATIVE

## 2014-12-06 MED ORDER — MEDROXYPROGESTERONE ACETATE 10 MG PO TABS: 10.0000 mg | ORAL_TABLET | Freq: Every day | ORAL | Status: DC

## 2015-02-10 ENCOUNTER — Ambulatory Visit (INDEPENDENT_AMBULATORY_CARE_PROVIDER_SITE_OTHER): Payer: BLUE CROSS/BLUE SHIELD | Admitting: Women's Health

## 2015-02-10 ENCOUNTER — Encounter: Payer: Self-pay | Admitting: Women's Health

## 2015-02-10 VITALS — BP 132/80

## 2015-02-10 DIAGNOSIS — N760 Acute vaginitis: Secondary | ICD-10-CM

## 2015-02-10 DIAGNOSIS — B3731 Acute candidiasis of vulva and vagina: Secondary | ICD-10-CM

## 2015-02-10 DIAGNOSIS — A499 Bacterial infection, unspecified: Secondary | ICD-10-CM

## 2015-02-10 DIAGNOSIS — N912 Amenorrhea, unspecified: Secondary | ICD-10-CM

## 2015-02-10 DIAGNOSIS — B373 Candidiasis of vulva and vagina: Secondary | ICD-10-CM | POA: Diagnosis not present

## 2015-02-10 DIAGNOSIS — N926 Irregular menstruation, unspecified: Secondary | ICD-10-CM | POA: Diagnosis not present

## 2015-02-10 DIAGNOSIS — B9689 Other specified bacterial agents as the cause of diseases classified elsewhere: Secondary | ICD-10-CM

## 2015-02-10 LAB — PREGNANCY, URINE: PREG TEST UR: NEGATIVE

## 2015-02-10 LAB — WET PREP FOR TRICH, YEAST, CLUE: TRICH WET PREP: NONE SEEN

## 2015-02-10 MED ORDER — MEDROXYPROGESTERONE ACETATE 10 MG PO TABS: 10.0000 mg | ORAL_TABLET | Freq: Every day | ORAL | Status: DC

## 2015-02-10 MED ORDER — FLUCONAZOLE 150 MG PO TABS: 150.0000 mg | ORAL_TABLET | Freq: Once | ORAL | Status: DC

## 2015-02-10 MED ORDER — METRONIDAZOLE 500 MG PO TABS
500.0000 mg | ORAL_TABLET | Freq: Two times a day (BID) | ORAL | Status: DC
Start: 2015-02-10 — End: 2016-01-23

## 2015-02-10 NOTE — Progress Notes (Signed)
Patient ID: Laura BruinsCarlise T Huang, female   DOB: April 10, 1991, 24 y.o.   MRN: [id number] Presents with amenorrhea, LMP in May after Provera. Would like to conceive, history of regular monthly cycles in the past, became more spaced in the past 6 months. Smoker. Same partner with negative STD screen. Normal TSH and prolactin. History of a TAB 4 years ago.  Exam: Appears well. UPT negative. Abdomen soft, obese. External genitalia within normal limits, speculum exam moderate amount of a white adherent discharge with odor noted, wet prep positive for yeast, amines, clues, TNTC bacteria. Bimanual uterus small nontender no adnexal tenderness.  Secondary amenorrhea Bacteria vaginosis and yeast Desiring conception  Plan: Qualitative hCG, if negative Provera 10 for 5 days, FSH and estradiol day 3 of next cycle, progesterone day 22-25 of next cycle. Flagyl 500 twice daily for  7 days. Diflucan 150 by mouth 1 dose. Call or return if no relief of discharge or if no cycle after Provera. Reviewed semen analysis and referral to fertility specialist.

## 2015-02-10 NOTE — Patient Instructions (Addendum)
Day 3 of next cycle FSH and Estadiol Day 22-25 Progesterone level Semen analysis  Primary Amenorrhea  Primary amenorrhea is the absence of any menstrual flow in a female by the age of 15 years. An average age for the start of menstruation is the age of 12 years. Primary amenorrhea is not considered to have occurred until a female is older than 15 years and has never menstruated. This may occur with or without other signs of puberty. CAUSES  Some common causes of not menstruating include:  Chromosomal abnormality causing the ovaries to malfunction is the most common cause of primary amenorrhea.  Malnutrition.  Low blood sugar (hypoglycemia).  Polycystic ovary syndrome (cysts in the ovaries, not ovulating).  Absence of the vagina, uterus, or ovaries since birth (congenital).  Extreme obesity.  Cystic fibrosis.  Drastic weight loss from any cause.  Over-exercising (running, biking) causing loss of body fat.  Pituitary gland tumor in the brain.  Long-term (chronic) illnesses.  Cushing disease.  Thyroid disease (hypothyroidism, hyperthyroidism).  Part of the brain (hypothalamus) not functioning normally.  Premature ovarian failure. SYMPTOMS  No menstruation by age 33 years in normally developed females is the primary symptom. Other symptoms may include:  Discharge from the breasts.  Hot flashes.  Adult acne.  Facial or chest hair.  Headaches.  Impaired vision.  Recent stress.  Changes in weight, diet, or exercise patterns. DIAGNOSIS  Primary amenorrhea is diagnosed with the help of a medical history and a physical exam. Other tests that may be recommended include:  Blood tests to check for pregnancy, hormonal changes, a bleeding or thyroid disorder, low iron levels (anemia), or other problems.  Urine tests.  Specialized X-ray exams. TREATMENT  Treatment will depend on the cause. For example, some of the causes of primary amenorrhea, such as congenital  absence of sex organs, will require surgery to correct. Others may respond to treatment with medicine. SEEK MEDICAL CARE IF:  There has not been any menstrual flow by age 51 years.  Body maturation does not occur at a level typical of peers.  Pelvic area pain occurs.  There is unusual weight gain or hair growth. Document Released: 07/22/2005 Document Revised: 05/12/2013 Document Reviewed: 03/03/2013 [facility] Patient Information 2015 [place], [place]. This information is not intended to replace advice given to you by your health care provider. Make sure you discuss any questions you have with your health care provider. Bacterial Vaginosis Bacterial vaginosis is an infection of the vagina. It happens when too many of certain germs (bacteria) grow in the vagina. HOME CARE  Take your medicine as told by your doctor.  Finish your medicine even if you start to feel better.  Do not have sex until you finish your medicine and are better.  Tell your sex partner that you have an infection. They should see their doctor for treatment.  Practice safe sex. Use condoms. Have only one sex partner. GET HELP IF:  You are not getting better after 3 days of treatment.  You have more grey fluid (discharge) coming from your vagina than before.  You have more pain than before.  You have a fever. MAKE SURE YOU:   Understand these instructions.  Will watch your condition.  Will get help right away if you are not doing well or get worse. Document Released: 04/30/2008 Document Revised: 05/12/2013 Document Reviewed: 03/03/2013 [facility] Patient Information 2015 [place], [place]. This information is not intended to replace advice given to you by your health care provider. Make sure  you discuss any questions you have with your health care provider. Smoking Cessation Quitting smoking is important to your health and has many advantages. However, it is not always easy to quit since nicotine is a very  addictive drug. Oftentimes, people try 3 times or more before being able to quit. This document explains the best ways for you to prepare to quit smoking. Quitting takes hard work and a lot of effort, but you can do it. ADVANTAGES OF QUITTING SMOKING  You will live longer, feel better, and live better.  Your body will feel the impact of quitting smoking almost immediately.  Within 20 minutes, blood pressure decreases. Your pulse returns to its normal level.  After 8 hours, carbon monoxide levels in the blood return to normal. Your oxygen level increases.  After 24 hours, the chance of having a heart attack starts to decrease. Your breath, hair, and body stop smelling like smoke.  After 48 hours, damaged nerve endings begin to recover. Your sense of taste and smell improve.  After 72 hours, the body is virtually free of nicotine. Your bronchial tubes relax and breathing becomes easier.  After 2 to 12 weeks, lungs can hold more air. Exercise becomes easier and circulation improves.  The risk of having a heart attack, stroke, cancer, or lung disease is greatly reduced.  After 1 year, the risk of coronary heart disease is cut in half.  After 5 years, the risk of stroke falls to the same as a nonsmoker.  After 10 years, the risk of lung cancer is cut in half and the risk of other cancers decreases significantly.  After 15 years, the risk of coronary heart disease drops, usually to the level of a nonsmoker.  If you are pregnant, quitting smoking will improve your chances of having a healthy baby.  The people you live with, especially any children, will be healthier.  You will have extra money to spend on things other than cigarettes. QUESTIONS TO THINK ABOUT BEFORE ATTEMPTING TO QUIT You may want to talk about your answers with your health care provider.  Why do you want to quit?  If you tried to quit in the past, what helped and what did not?  What will be the most difficult  situations for you after you quit? How will you plan to handle them?  Who can help you through the tough times? Your family? Friends? A health care provider?  What pleasures do you get from smoking? What ways can you still get pleasure if you quit? Here are some questions to ask your health care provider:  How can you help me to be successful at quitting?  What medicine do you think would be best for me and how should I take it?  What should I do if I need more help?  What is smoking withdrawal like? How can I get information on withdrawal? GET READY  Set a quit date.  Change your environment by getting rid of all cigarettes, ashtrays, matches, and lighters in your home, car, or work. Do not let people smoke in your home.  Review your past attempts to quit. Think about what worked and what did not. GET SUPPORT AND ENCOURAGEMENT You have a better chance of being successful if you have help. You can get support in many ways.  Tell your family, friends, and coworkers that you are going to quit and need their support. Ask them not to smoke around you.  Get individual, group, or telephone counseling  and support. Programs are available at [employer] and health centers. Call your local health department for information about programs in your area.  Spiritual beliefs and practices may help some smokers quit.  Download a "quit meter" on your computer to keep track of quit statistics, such as how long you have gone without smoking, cigarettes not smoked, and money saved.  Get a self-help book about quitting smoking and staying off tobacco. LEARN NEW SKILLS AND BEHAVIORS  Distract yourself from urges to smoke. Talk to someone, go for a walk, or occupy your time with a task.  Change your normal routine. Take a different route to work. Drink tea instead of coffee. Eat breakfast in a different place.  Reduce your stress. Take a hot bath, exercise, or read a book.  Plan something  enjoyable to do every day. Reward yourself for not smoking.  Explore interactive web-based programs that specialize in helping you quit. GET MEDICINE AND USE IT CORRECTLY Medicines can help you stop smoking and decrease the urge to smoke. Combining medicine with the above behavioral methods and support can greatly increase your chances of successfully quitting smoking.  Nicotine replacement therapy helps deliver nicotine to your body without the negative effects and risks of smoking. Nicotine replacement therapy includes nicotine gum, lozenges, inhalers, nasal sprays, and skin patches. Some may be available over-the-counter and others require a prescription.  Antidepressant medicine helps people abstain from smoking, but how this works is unknown. This medicine is available by prescription.  Nicotinic receptor partial agonist medicine simulates the effect of nicotine in your brain. This medicine is available by prescription. Ask your health care provider for advice about which medicines to use and how to use them based on your health history. Your health care provider will tell you what side effects to look out for if you choose to be on a medicine or therapy. Carefully read the information on the package. Do not use any other product containing nicotine while using a nicotine replacement product.  RELAPSE OR DIFFICULT SITUATIONS Most relapses occur within the first 3 months after quitting. Do not be discouraged if you start smoking again. Remember, most people try several times before finally quitting. You may have symptoms of withdrawal because your body is used to nicotine. You may crave cigarettes, be irritable, feel very hungry, cough often, get headaches, or have difficulty concentrating. The withdrawal symptoms are only temporary. They are strongest when you first quit, but they will go away within 10-14 days. To reduce the chances of relapse, try to:  Avoid drinking alcohol. Drinking lowers  your chances of successfully quitting.  Reduce the amount of caffeine you consume. Once you quit smoking, the amount of caffeine in your body increases and can give you symptoms, such as a rapid heartbeat, sweating, and anxiety.  Avoid smokers because they can make you want to smoke.  Do not let weight gain distract you. Many smokers will gain weight when they quit, usually less than 10 pounds. Eat a healthy diet and stay active. You can always lose the weight gained after you quit.  Find ways to improve your mood other than smoking. FOR MORE INFORMATION  www.smokefree.gov  Document Released: 07/16/2001 Document Revised: 12/06/2013 Document Reviewed: 10/31/2011 [facility] Patient Information 2015 [place], [place]. This information is not intended to replace advice given to you by your health care provider. Make sure you discuss any questions you have with your health care provider.

## 2015-02-11 ENCOUNTER — Encounter: Payer: Self-pay | Admitting: Women's Health

## 2015-02-11 LAB — HCG, SERUM, QUALITATIVE: PREG SERUM: NEGATIVE

## 2015-02-21 ENCOUNTER — Other Ambulatory Visit: Payer: BLUE CROSS/BLUE SHIELD

## 2015-02-21 DIAGNOSIS — N926 Irregular menstruation, unspecified: Secondary | ICD-10-CM

## 2015-02-24 ENCOUNTER — Encounter: Payer: BLUE CROSS/BLUE SHIELD | Admitting: Women's Health

## 2015-02-25 LAB — ESTRADIOL: ESTRADIOL: 47.5 pg/mL

## 2015-02-25 LAB — FOLLICLE STIMULATING HORMONE: FSH: 5.7 m[IU]/mL

## 2015-02-27 ENCOUNTER — Other Ambulatory Visit: Payer: Self-pay | Admitting: Women's Health

## 2015-02-27 DIAGNOSIS — N979 Female infertility, unspecified: Secondary | ICD-10-CM

## 2015-03-13 ENCOUNTER — Other Ambulatory Visit: Payer: BLUE CROSS/BLUE SHIELD

## 2015-03-13 DIAGNOSIS — N926 Irregular menstruation, unspecified: Secondary | ICD-10-CM

## 2015-03-14 LAB — PROGESTERONE: PROGESTERONE: 0.7 ng/mL

## 2015-03-15 ENCOUNTER — Telehealth: Payer: Self-pay | Admitting: *Deleted

## 2015-03-15 NOTE — Telephone Encounter (Signed)
-----   Message from Keenan Bachelor, [place] sent at 03/14/2015 12:38 PM EDT ----- Regarding: referral to Dr. April Manson Notes Recorded by Harrington Challenger, NP on 03/14/2015 at 7:29 AM Please call and review progesterone is very low, .7 best if >6. Refer to Dr Marcina Millard. Send labs    Patient informed. She will wait to hear regarding appt.

## 2015-03-15 NOTE — Telephone Encounter (Signed)
Referral faxed to Dr.Yaycinkaya office they will contact pt to schedule.

## 2015-03-30 NOTE — Telephone Encounter (Signed)
Appointment on 05/18/15

## 2015-08-03 ENCOUNTER — Other Ambulatory Visit: Payer: Self-pay | Admitting: Women's Health

## 2015-08-03 DIAGNOSIS — N926 Irregular menstruation, unspecified: Secondary | ICD-10-CM

## 2015-08-03 NOTE — Progress Notes (Signed)
Telephone call, reviewed ultrasound to check follicle measurements and endometrial thickness [facility]. Requested ultrasound  day 17 or 18 of cycle which was 12/27 or 12/28 no ultrasound appointments were available will check ultrasound after next cycle. Instructed to call day one of next cycle to schedule.

## 2015-12-20 ENCOUNTER — Telehealth: Payer: Self-pay | Admitting: *Deleted

## 2015-12-20 DIAGNOSIS — N912 Amenorrhea, unspecified: Secondary | ICD-10-CM

## 2015-12-20 MED ORDER — MEDROXYPROGESTERONE ACETATE 10 MG PO TABS: 10.0000 mg | ORAL_TABLET | Freq: Every day | ORAL | Status: DC

## 2015-12-20 NOTE — Telephone Encounter (Signed)
If home pregnancy test was negative she can take Provera 10 mg 1 by mouth daily for 10 days. Please make sure she schedules her annual exam.

## 2015-12-20 NOTE — Telephone Encounter (Signed)
Pt aware, Rx sent, transferred appt. to schedule exam

## 2015-12-20 NOTE — Telephone Encounter (Signed)
(  You are back MD) pt called requesting refill provera to start cycle, LMP:Jan 2017, pt was referred to Dr.Yalcinkaya for infertility decided to take a break from the infertility medications for now. Negative UPT, pt is overdue for annual, I explained to her that she is overdue and best to schedule OV. Pt asked me to still relay request to you. Please advise

## 2016-01-23 ENCOUNTER — Telehealth: Payer: Self-pay | Admitting: *Deleted

## 2016-01-23 ENCOUNTER — Encounter: Payer: Self-pay | Admitting: Women's Health

## 2016-01-23 ENCOUNTER — Ambulatory Visit (INDEPENDENT_AMBULATORY_CARE_PROVIDER_SITE_OTHER): Payer: BLUE CROSS/BLUE SHIELD | Admitting: Women's Health

## 2016-01-23 VITALS — BP 118/80 | Ht 63.0 in | Wt 211.0 lb

## 2016-01-23 DIAGNOSIS — B9689 Other specified bacterial agents as the cause of diseases classified elsewhere: Secondary | ICD-10-CM

## 2016-01-23 DIAGNOSIS — A499 Bacterial infection, unspecified: Secondary | ICD-10-CM | POA: Diagnosis not present

## 2016-01-23 DIAGNOSIS — E282 Polycystic ovarian syndrome: Secondary | ICD-10-CM | POA: Diagnosis not present

## 2016-01-23 DIAGNOSIS — N760 Acute vaginitis: Secondary | ICD-10-CM | POA: Diagnosis not present

## 2016-01-23 DIAGNOSIS — N898 Other specified noninflammatory disorders of vagina: Secondary | ICD-10-CM

## 2016-01-23 DIAGNOSIS — Z01419 Encounter for gynecological examination (general) (routine) without abnormal findings: Secondary | ICD-10-CM

## 2016-01-23 LAB — CBC WITH DIFFERENTIAL/PLATELET
BASOS ABS: 0 {cells}/uL (ref 0–200)
Basophils Relative: 0 %
EOS PCT: 2 %
Eosinophils Absolute: 132 cells/uL (ref 15–500)
HCT: 39.3 % (ref 35.0–45.0)
HEMOGLOBIN: 12.9 g/dL (ref 11.7–15.5)
LYMPHS PCT: 44 %
Lymphs Abs: 2904 cells/uL (ref 850–3900)
MCH: 28.3 pg (ref 27.0–33.0)
MCHC: 32.8 g/dL (ref 32.0–36.0)
MCV: 86.2 fL (ref 80.0–100.0)
MONOS PCT: 9 %
MPV: 9.4 fL (ref 7.5–12.5)
Monocytes Absolute: 594 cells/uL (ref 200–950)
NEUTROS PCT: 45 %
Neutro Abs: 2970 cells/uL (ref 1500–7800)
Platelets: 259 10*3/uL (ref 140–400)
RBC: 4.56 MIL/uL (ref 3.80–5.10)
RDW: 14.8 % (ref 11.0–15.0)
WBC: 6.6 10*3/uL (ref 3.8–10.8)

## 2016-01-23 LAB — WET PREP FOR TRICH, YEAST, CLUE
TRICH WET PREP: NONE SEEN
YEAST WET PREP: NONE SEEN

## 2016-01-23 MED ORDER — METFORMIN HCL 500 MG PO TABS: ORAL_TABLET | ORAL | Status: DC

## 2016-01-23 MED ORDER — METRONIDAZOLE 0.75 % VA GEL: VAGINAL | Status: DC

## 2016-01-23 MED ORDER — METFORMIN HCL 500 MG PO TABS: 500.0000 mg | ORAL_TABLET | Freq: Two times a day (BID) | ORAL | Status: DC

## 2016-01-23 MED ORDER — FLUCONAZOLE 150 MG PO TABS: 150.0000 mg | ORAL_TABLET | Freq: Once | ORAL | Status: DC

## 2016-01-23 NOTE — Telephone Encounter (Signed)
Pt called stating Rx for metforium 500 mg  not at the pharmacy, Rx will be re-submitted. Pt aware.

## 2016-01-23 NOTE — Patient Instructions (Signed)
Health Maintenance, Female Adopting a healthy lifestyle and getting preventive care can go a long way to promote health and wellness. Talk with your health care provider about what schedule of regular examinations is right for you. This is a good chance for you to check in with your provider about disease prevention and staying healthy. In between checkups, there are plenty of things you can do on your own. Experts have done a lot of research about which lifestyle changes and preventive measures are most likely to keep you healthy. Ask your health care provider for more information. WEIGHT AND DIET  Eat a healthy diet  Be sure to include plenty of vegetables, fruits, low-fat dairy products, and lean protein.  Do not eat a lot of foods high in solid fats, added sugars, or salt.  Get regular exercise. This is one of the most important things you can do for your health.  Most adults should exercise for at least 150 minutes each week. The exercise should increase your heart rate and make you sweat (moderate-intensity exercise).  Most adults should also do strengthening exercises at least twice a week. This is in addition to the moderate-intensity exercise.  Maintain a healthy weight  Body mass index (BMI) is a measurement that can be used to identify possible weight problems. It estimates body fat based on height and weight. Your health care provider can help determine your BMI and help you achieve or maintain a healthy weight.  For females 59 years of age and older:   A BMI below 18.5 is considered underweight.  A BMI of 18.5 to 24.9 is normal.  A BMI of 25 to 29.9 is considered overweight.  A BMI of 30 and above is considered obese.  Watch levels of cholesterol and blood lipids  You should start having your blood tested for lipids and cholesterol at 26 years of age, then have this test every 5 years.  You may need to have your cholesterol levels checked more often if:  Your lipid  or cholesterol levels are high.  You are older than 26 years of age.  You are at high risk for heart disease.  CANCER SCREENING   Lung Cancer  Lung cancer screening is recommended for adults 21-48 years old who are at high risk for lung cancer because of a history of smoking.  A yearly low-dose CT scan of the lungs is recommended for people who:  Currently smoke.  Have quit within the past 15 years.  Have at least a 30-pack-year history of smoking. A pack year is smoking an average of one pack of cigarettes a day for 1 year.  Yearly screening should continue until it has been 15 years since you quit.  Yearly screening should stop if you develop a health problem that would prevent you from having lung cancer treatment.  Breast Cancer  Practice breast self-awareness. This means understanding how your breasts normally appear and feel.  It also means doing regular breast self-exams. Let your health care provider know about any changes, no matter how small.  If you are in your 20s or 30s, you should have a clinical breast exam (CBE) by a health care provider every 1-3 years as part of a regular health exam.  If you are 60 or older, have a CBE every year. Also consider having a breast X-ray (mammogram) every year.  If you have a family history of breast cancer, talk to your health care provider about genetic screening.  If you  are at high risk for breast cancer, talk to your health care provider about having an MRI and a mammogram every year.  Breast cancer gene (BRCA) assessment is recommended for women who have family members with BRCA-related cancers. BRCA-related cancers include:  Breast.  Ovarian.  Tubal.  Peritoneal cancers.  Results of the assessment will determine the need for genetic counseling and BRCA1 and BRCA2 testing. Cervical Cancer Your health care provider may recommend that you be screened regularly for cancer of the pelvic organs (ovaries, uterus, and  vagina). This screening involves a pelvic examination, including checking for microscopic changes to the surface of your cervix (Pap test). You may be encouraged to have this screening done every 3 years, beginning at age 59.  For women ages 39-65, health care providers may recommend pelvic exams and Pap testing every 3 years, or they may recommend the Pap and pelvic exam, combined with testing for human papilloma virus (HPV), every 5 years. Some types of HPV increase your risk of cervical cancer. Testing for HPV may also be done on women of any age with unclear Pap test results.  Other health care providers may not recommend any screening for nonpregnant women who are considered low risk for pelvic cancer and who do not have symptoms. Ask your health care provider if a screening pelvic exam is right for you.  If you have had past treatment for cervical cancer or a condition that could lead to cancer, you need Pap tests and screening for cancer for at least 20 years after your treatment. If Pap tests have been discontinued, your risk factors (such as having a new sexual partner) need to be reassessed to determine if screening should resume. Some women have medical problems that increase the chance of getting cervical cancer. In these cases, your health care provider may recommend more frequent screening and Pap tests. Colorectal Cancer  This type of cancer can be detected and often prevented.  Routine colorectal cancer screening usually begins at 26 years of age and continues through 26 years of age.  Your health care provider may recommend screening at an earlier age if you have risk factors for colon cancer.  Your health care provider may also recommend using home test kits to check for hidden blood in the stool.  A small camera at the end of a tube can be used to examine your colon directly (sigmoidoscopy or colonoscopy). This is done to check for the earliest forms of colorectal  cancer.  Routine screening usually begins at age 15.  Direct examination of the colon should be repeated every 5-10 years through 26 years of age. However, you may need to be screened more often if early forms of precancerous polyps or small growths are found. Skin Cancer  Check your skin from head to toe regularly.  Tell your health care provider about any new moles or changes in moles, especially if there is a change in a mole's shape or color.  Also tell your health care provider if you have a mole that is larger than the size of a pencil eraser.  Always use sunscreen. Apply sunscreen liberally and repeatedly throughout the day.  Protect yourself by wearing long sleeves, pants, a wide-brimmed hat, and sunglasses whenever you are outside. HEART DISEASE, DIABETES, AND HIGH BLOOD PRESSURE   High blood pressure causes heart disease and increases the risk of stroke. High blood pressure is more likely to develop in:  People who have blood pressure in the high end  of the normal range (130-139/85-89 mm Hg).  People who are overweight or obese.  People who are African American.  If you are 14-72 years of age, have your blood pressure checked every 3-5 years. If you are 41 years of age or older, have your blood pressure checked every year. You should have your blood pressure measured twice--once when you are at a hospital or clinic, and once when you are not at a hospital or clinic. Record the average of the two measurements. To check your blood pressure when you are not at a hospital or clinic, you can use:  An automated blood pressure machine at a pharmacy.  A home blood pressure monitor.  If you are between 74 years and 15 years old, ask your health care provider if you should take aspirin to prevent strokes.  Have regular diabetes screenings. This involves taking a blood sample to check your fasting blood sugar level.  If you are at a normal weight and have a low risk for diabetes,  have this test once every three years after 26 years of age.  If you are overweight and have a high risk for diabetes, consider being tested at a younger age or more often. PREVENTING INFECTION  Hepatitis B  If you have a higher risk for hepatitis B, you should be screened for this virus. You are considered at high risk for hepatitis B if:  You were born in a country where hepatitis B is common. Ask your health care provider which countries are considered high risk.  Your parents were born in a high-risk country, and you have not been immunized against hepatitis B (hepatitis B vaccine).  You have HIV or AIDS.  You use needles to inject street drugs.  You live with someone who has hepatitis B.  You have had sex with someone who has hepatitis B.  You get hemodialysis treatment.  You take certain medicines for conditions, including cancer, organ transplantation, and autoimmune conditions. Hepatitis C  Blood testing is recommended for:  Everyone born from [birth year] through 1966.  Anyone with known risk factors for hepatitis C. Sexually transmitted infections (STIs)  You should be screened for sexually transmitted infections (STIs) including gonorrhea and chlamydia if:  You are sexually active and are younger than 26 years of age.  You are older than 26 years of age and your health care provider tells you that you are at risk for this type of infection.  Your sexual activity has changed since you were last screened and you are at an increased risk for chlamydia or gonorrhea. Ask your health care provider if you are at risk.  If you do not have HIV, but are at risk, it may be recommended that you take a prescription medicine daily to prevent HIV infection. This is called pre-exposure prophylaxis (PrEP). You are considered at risk if:  You are sexually active and do not regularly use condoms or know the HIV status of your partner(s).  You take drugs by injection.  You are sexually  active with a partner who has HIV. Talk with your health care provider about whether you are at high risk of being infected with HIV. If you choose to begin PrEP, you should first be tested for HIV. You should then be tested every 3 months for as long as you are taking PrEP.  PREGNANCY   If you are premenopausal and you may become pregnant, ask your health care provider about preconception counseling.  If you may  become pregnant, take 400 to 800 micrograms (mcg) of folic acid every day.  If you want to prevent pregnancy, talk to your health care provider about birth control (contraception). OSTEOPOROSIS AND MENOPAUSE   Osteoporosis is a disease in which the bones lose minerals and strength with aging. This can result in serious bone fractures. Your risk for osteoporosis can be identified using a bone density scan.  If you are 61 years of age or older, or if you are at risk for osteoporosis and fractures, ask your health care provider if you should be screened.  Ask your health care provider whether you should take a calcium or vitamin D supplement to lower your risk for osteoporosis.  Menopause may have certain physical symptoms and risks.  Hormone replacement therapy may reduce some of these symptoms and risks. Talk to your health care provider about whether hormone replacement therapy is right for you.  HOME CARE INSTRUCTIONS   Schedule regular health, dental, and eye exams.  Stay current with your immunizations.   Do not use any tobacco products including cigarettes, chewing tobacco, or electronic cigarettes.  If you are pregnant, do not drink alcohol.  If you are breastfeeding, limit how much and how often you drink alcohol.  Limit alcohol intake to no more than 1 drink per day for nonpregnant women. One drink equals 12 ounces of beer, 5 ounces of wine, or 1 ounces of hard liquor.  Do not use street drugs.  Do not share needles.  Ask your health care provider for help if  you need support or information about quitting drugs.  Tell your health care provider if you often feel depressed.  Tell your health care provider if you have ever been abused or do not feel safe at home.   This information is not intended to replace advice given to you by your health care provider. Make sure you discuss any questions you have with your health care provider.   Document Released: 02/04/2011 Document Revised: 08/12/2014 Document Reviewed: 06/23/2013 Elsevier Interactive Patient Education [employer].

## 2016-01-23 NOTE — Progress Notes (Signed)
Laura Huang [birth year] [id number]    History:    Presents for annual exam.  History of irregular cycles with normal TSH and prolactin. Desiring conception, has seen a fertility specialist lives in [place]. Estrogen and FSH normal low progesterone. Partner normal semen analysis. Had been on Femara for 2 cycles without conception. Cycles became more regular when she was on metformin at thousand milligrams daily had difficulty swallowing pills stopped metformin. 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 States has occasional tightness and numbness in the right arm mostly after work, no shortness of breath.  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  ROS:  A ROS was performed and pertinent positives and negatives are included.  Exam:  Filed Vitals:   01/23/16 1121  BP: 118/80    General appearance:  Normal Thyroid:  Symmetrical, normal in size, without palpable masses or nodularity. Respiratory  Auscultation:  Clear without wheezing or rhonchi Cardiovascular  Auscultation:  Regular rate, without rubs, murmurs or gallops  Edema/varicosities:  Not grossly evident Abdominal  Soft,nontender, without masses, guarding or rebound.  Liver/spleen:  No organomegaly noted  Hernia:  None appreciated  Skin  Inspection:  Grossly normal   Breasts: Examined lying and sitting.     Right: Without masses, retractions, discharge or axillary adenopathy.     Left: Without masses, retractions, discharge or axillary adenopathy. Gentitourinary   Inguinal/mons:  Normal without inguinal adenopathy  External genitalia:  Normal  BUS/Urethra/Skene's glands:  Normal  Vagina:  Moderate white adherent discharge with odor, wet prep positive for moderate clues, TNTC bacteria.  Cervix:  Normal  Uterus:   normal in size, shape and contour.  Midline and mobile  Adnexa/parametria:     Rt: Without masses or tenderness.   Lt: Without  masses or tenderness.  Anus and perineum: Normal   Assessment/Plan:  25 y.o. SBF G0 for annual exam desiring conception.  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 Follow-up with fertility specialist. Reviewed importance of frequent intercourse, continue prenatal vitamin daily, calcium rich diet, decrease calories for weight loss encouraged. SBE's, exercise encouraged. CBC, UA, Pap.,    Laura Huang [facility], 11:55 AM 01/23/2016

## 2016-01-24 ENCOUNTER — Encounter: Payer: Self-pay | Admitting: Women's Health

## 2016-01-24 ENCOUNTER — Telehealth: Payer: Self-pay

## 2016-01-24 DIAGNOSIS — E282 Polycystic ovarian syndrome: Secondary | ICD-10-CM

## 2016-01-24 LAB — URINALYSIS W MICROSCOPIC + REFLEX CULTURE
BACTERIA UA: NONE SEEN [HPF]
BILIRUBIN URINE: NEGATIVE
Casts: NONE SEEN [LPF]
Crystals: NONE SEEN [HPF]
GLUCOSE, UA: NEGATIVE
HGB URINE DIPSTICK: NEGATIVE
KETONES UR: NEGATIVE
LEUKOCYTES UA: NEGATIVE
NITRITE: NEGATIVE
PH: 6.5 (ref 5.0–8.0)
Protein, ur: NEGATIVE
RBC / HPF: NONE SEEN RBC/HPF (ref <=2)
SQUAMOUS EPITHELIAL / LPF: NONE SEEN [HPF] (ref <=5)
Specific Gravity, Urine: 1.023 (ref 1.001–1.035)
WBC UA: NONE SEEN WBC/HPF (ref <=5)
YEAST: NONE SEEN [HPF]

## 2016-01-24 LAB — PAP IG W/ RFLX HPV ASCU

## 2016-01-24 MED ORDER — METFORMIN HCL 500 MG PO TABS: 1000.0000 mg | ORAL_TABLET | Freq: Two times a day (BID) | ORAL | Status: DC

## 2016-01-24 NOTE — Telephone Encounter (Signed)
Yes she is to take to 500 mg tablets twice daily which would be 4 tablets total daily she is unable to swallow the 1000 mg metformin. Sorry for the confusion

## 2016-01-24 NOTE — Telephone Encounter (Signed)
Pharmacy sent a note stating "patient requests new Rx (for Metformin 500mg ) to take two tablets BID."  There was some confusion with original Rx because it had two sets of instructions attached. Then Victorino DikeJennifer resent it yesterday with the one tab bid instructions.  I resent Rx with 1000mg  (2 500mg  tabs) twice daily.  I believe this is correct directions based on your note.

## 2016-03-24 ENCOUNTER — Other Ambulatory Visit: Payer: Self-pay | Admitting: Women's Health

## 2016-03-24 DIAGNOSIS — N760 Acute vaginitis: Principal | ICD-10-CM

## 2016-03-24 DIAGNOSIS — B9689 Other specified bacterial agents as the cause of diseases classified elsewhere: Secondary | ICD-10-CM

## 2016-04-19 ENCOUNTER — Encounter: Payer: Self-pay | Admitting: Women's Health

## 2016-04-19 ENCOUNTER — Ambulatory Visit (INDEPENDENT_AMBULATORY_CARE_PROVIDER_SITE_OTHER): Payer: BLUE CROSS/BLUE SHIELD | Admitting: Women's Health

## 2016-04-19 VITALS — BP 126/80 | Ht 63.0 in

## 2016-04-19 DIAGNOSIS — R350 Frequency of micturition: Secondary | ICD-10-CM | POA: Diagnosis not present

## 2016-04-19 DIAGNOSIS — N912 Amenorrhea, unspecified: Secondary | ICD-10-CM

## 2016-04-19 DIAGNOSIS — E282 Polycystic ovarian syndrome: Secondary | ICD-10-CM | POA: Diagnosis not present

## 2016-04-19 DIAGNOSIS — R1031 Right lower quadrant pain: Secondary | ICD-10-CM | POA: Diagnosis not present

## 2016-04-19 DIAGNOSIS — A499 Bacterial infection, unspecified: Secondary | ICD-10-CM | POA: Diagnosis not present

## 2016-04-19 DIAGNOSIS — N898 Other specified noninflammatory disorders of vagina: Secondary | ICD-10-CM

## 2016-04-19 DIAGNOSIS — B9689 Other specified bacterial agents as the cause of diseases classified elsewhere: Secondary | ICD-10-CM

## 2016-04-19 DIAGNOSIS — N760 Acute vaginitis: Secondary | ICD-10-CM

## 2016-04-19 LAB — URINALYSIS W MICROSCOPIC + REFLEX CULTURE
BILIRUBIN URINE: NEGATIVE
Casts: NONE SEEN [LPF]
Crystals: NONE SEEN [HPF]
GLUCOSE, UA: NEGATIVE
HGB URINE DIPSTICK: NEGATIVE
Ketones, ur: NEGATIVE
Nitrite: NEGATIVE
Protein, ur: NEGATIVE
RBC / HPF: NONE SEEN RBC/HPF (ref <=2)
Specific Gravity, Urine: 1.02 (ref 1.001–1.035)
Yeast: NONE SEEN [HPF]
pH: 5.5 (ref 5.0–8.0)

## 2016-04-19 LAB — WET PREP FOR TRICH, YEAST, CLUE
Trich, Wet Prep: NONE SEEN
WBC WET PREP: NONE SEEN
Yeast Wet Prep HPF POC: NONE SEEN

## 2016-04-19 LAB — PREGNANCY, URINE: Preg Test, Ur: NEGATIVE

## 2016-04-19 MED ORDER — METFORMIN HCL 500 MG/5ML PO SOLN: ORAL | 12 refills | Status: DC

## 2016-04-19 MED ORDER — METRONIDAZOLE 0.75 % VA GEL: VAGINAL | 0 refills | Status: DC

## 2016-04-19 MED ORDER — MEDROXYPROGESTERONE ACETATE 10 MG PO TABS: 10.0000 mg | ORAL_TABLET | Freq: Every day | ORAL | 0 refills | Status: DC

## 2016-04-19 NOTE — Progress Notes (Signed)
Presents today for malodorous urine, frequency, urgency, and lower right abdominal pain for about 2 weeks. Right abdominal pain is an aching pain, increased with activity/movement, rated a 6-7/10 at the worst, minimal now . Same partner for several years, denies need for STD check. Denies hematuria, dysuria, pelvic pain, malodorous or abnormal discharge, constipation or fevers. History of irregular cycles/PCOS, regulated when on Metformin, but having trouble swallowing tablets. LMP unknown. Desiring conception.  Exam: Appears well. Abdomen soft, non-tender to palpation in all four quadrants. No rebound or guarding. External genitalia within normal limits. Speculum exam vaginal walls and cervix normal, non-erythematous, moderate white adherent discharge. No odor. Wet prep with few clue cells, many bacteria. Urine pregnancy negative. Serum hCG pending.  UA - trace leukocytes, 6-10 WBCs, moderate bacteria, few clue cells. Urine culture pending.  Bacterial Vaginosis Irregular cycles/PCOS  Plan: Metrogel 0.75%, one applicator inserted vaginally each night for 5 days. Discussed alcohol precautions. Provera 10 mg PO daily for 5 days, to be started after results of negative blood pregnancy test. Will try Metformin HCl 500 mg/5 mL solution, 10cc BID, for cycle regulation. Will follow-up with blood pregnancy test and urine culture results. Instructed to call our office if symptoms worsen or do not improve with medication. Continue prenatal vitamin daily, safe pregnancy behaviors reviewed.

## 2016-04-19 NOTE — Patient Instructions (Signed)

## 2016-04-19 NOTE — Addendum Note (Signed)
Addended by: Kem ParkinsonBARNES, Kolby Myung on: 04/19/2016 12:23 PM   Modules accepted: Orders

## 2016-04-20 LAB — HCG, SERUM, QUALITATIVE: Preg, Serum: NEGATIVE

## 2016-04-22 ENCOUNTER — Other Ambulatory Visit: Payer: Self-pay | Admitting: Women's Health

## 2016-04-22 LAB — URINE CULTURE

## 2016-04-22 MED ORDER — SULFAMETHOXAZOLE-TRIMETHOPRIM 800-160 MG PO TABS: 1.0000 | ORAL_TABLET | Freq: Two times a day (BID) | ORAL | 0 refills | Status: DC

## 2016-04-23 ENCOUNTER — Ambulatory Visit: Payer: BLUE CROSS/BLUE SHIELD | Admitting: Women's Health

## 2016-04-24 ENCOUNTER — Ambulatory Visit: Payer: BLUE CROSS/BLUE SHIELD | Admitting: Women's Health

## 2016-07-03 ENCOUNTER — Telehealth: Payer: Self-pay

## 2016-07-03 NOTE — Telephone Encounter (Signed)
Patient called asking if she could get Rx for Provera and stated she has not had a period since she was in the office last. I called her back and got her voice mail and told her I had couple of questions. I left them in her voice mail and asked her to call me with answers. #1 Did she take the Provera that was prescribed at 04/19/16 visit and if she did did she had a period after than? #2 Did she start the Metformin that was prescribed at that visit?

## 2016-07-03 NOTE — Telephone Encounter (Addendum)
See note above. Patient called back. She said that she did take Provera in Sept after visit and had a period but nothing since then.  Ins did not cover the Metformin solution so she is taking the pills still.

## 2016-07-04 NOTE — Telephone Encounter (Signed)
Telephone call, history of PCOS, will take a home U PT, continue on the metformin, will increase dose to 1000 mg in the a.m. and 500 mg p.m. If UPT negative will withdrawal with Provera. Desires pregnancy.

## 2016-07-10 NOTE — Telephone Encounter (Signed)
Message left, (did not called back after taking home UPT.)

## 2016-07-16 ENCOUNTER — Other Ambulatory Visit: Payer: Self-pay | Admitting: Women's Health

## 2016-07-16 DIAGNOSIS — N912 Amenorrhea, unspecified: Secondary | ICD-10-CM

## 2016-07-16 MED ORDER — MEDROXYPROGESTERONE ACETATE 10 MG PO TABS: 10.0000 mg | ORAL_TABLET | Freq: Every day | ORAL | 0 refills | Status: DC

## 2016-07-16 NOTE — Telephone Encounter (Signed)
Telephone call, states UPT negative, history of PCO OS with amenorrhea. Has had several negative UPT's over the past month. Provera 10 for 5 days will call if no cycle.

## 2016-07-16 NOTE — Progress Notes (Signed)
Telephone call history of PCO S with amenorrhea negative UPT will take Provera 10 mg for 5 days and call if no cycle Rx called in.

## 2016-09-05 ENCOUNTER — Telehealth: Payer: Self-pay | Admitting: *Deleted

## 2016-09-05 NOTE — Telephone Encounter (Signed)
Pt called to update with telephone encounter 07/03/16. Completed provera 10 mg, cycle stated lasted 5 days, normal flow. States this cycle started on 09/03/16 has been heavy changing pad every hour, having cramps, feels tired and weak. Pt does have history of PCOS with amenorrhea. Pt would like suggestions? Please advise

## 2016-09-05 NOTE — Telephone Encounter (Signed)
Telephone call, reviewed great that she had a spontaneous cycle, encouraged to be sexually active every other day after cycle over, desires conception. Motrin as needed for the discomfort.

## 2016-12-18 ENCOUNTER — Encounter: Payer: Self-pay | Admitting: Gynecology

## 2017-02-06 ENCOUNTER — Encounter: Payer: Self-pay | Admitting: Gynecology

## 2017-02-06 ENCOUNTER — Ambulatory Visit (INDEPENDENT_AMBULATORY_CARE_PROVIDER_SITE_OTHER): Payer: BLUE CROSS/BLUE SHIELD | Admitting: Gynecology

## 2017-02-06 VITALS — BP 124/80

## 2017-02-06 DIAGNOSIS — N912 Amenorrhea, unspecified: Secondary | ICD-10-CM

## 2017-02-06 DIAGNOSIS — R102 Pelvic and perineal pain: Secondary | ICD-10-CM | POA: Diagnosis not present

## 2017-02-06 DIAGNOSIS — N898 Other specified noninflammatory disorders of vagina: Secondary | ICD-10-CM | POA: Diagnosis not present

## 2017-02-06 DIAGNOSIS — Z113 Encounter for screening for infections with a predominantly sexual mode of transmission: Secondary | ICD-10-CM

## 2017-02-06 DIAGNOSIS — R350 Frequency of micturition: Secondary | ICD-10-CM

## 2017-02-06 LAB — URINALYSIS W MICROSCOPIC + REFLEX CULTURE
Bilirubin Urine: NEGATIVE
Casts: NONE SEEN [LPF]
Crystals: NONE SEEN [HPF]
GLUCOSE, UA: NEGATIVE
Ketones, ur: NEGATIVE
LEUKOCYTES UA: NEGATIVE
Nitrite: NEGATIVE
PH: 5.5 (ref 5.0–8.0)
Protein, ur: NEGATIVE
Specific Gravity, Urine: >1.03 (ref 1.001–1.035)
Yeast: NONE SEEN [HPF]

## 2017-02-06 LAB — WET PREP FOR TRICH, YEAST, CLUE
TRICH WET PREP: NONE SEEN
YEAST WET PREP: NONE SEEN

## 2017-02-06 LAB — PREGNANCY, URINE: Preg Test, Ur: NEGATIVE

## 2017-02-06 MED ORDER — METRONIDAZOLE 500 MG PO TABS: 500.0000 mg | ORAL_TABLET | Freq: Two times a day (BID) | ORAL | 0 refills | Status: DC

## 2017-02-06 MED ORDER — MEDROXYPROGESTERONE ACETATE 10 MG PO TABS: ORAL_TABLET | ORAL | 4 refills | Status: DC

## 2017-02-06 MED ORDER — PHENAZOPYRIDINE HCL 200 MG PO TABS: 200.0000 mg | ORAL_TABLET | Freq: Three times a day (TID) | ORAL | 0 refills | Status: DC | PRN

## 2017-02-06 NOTE — Progress Notes (Addendum)
Patient ID: Laura BruinsCarlise T Kruk, female   DOB: 11-Aug-1990, 26 y.o.   MRN: [id number]   Patient is a 26 year old with history of chronic anovulation has been followed by an infertility doctor has been on and off on Femera but has not taken it recently because of the way she feels. Patient was complaining of a vaginal discharge with odor no change in sexual partners no form of contraception reports a last menstrual cycle sometimes back in April of this year. She denies any nausea, vomiting, nipple discharge, visual disturbances or any unusual headaches.  Exam: Abdomen: Soft nontender no rebound or guarding Pelvic: Bartholin urethra Skene was within normal limits Vagina: Brown foul odor discharge noted Cervix no gross lesions on inspection Bimanual exam of her limits of normal anteverted uterus nontender. Adnexa right tender nontender left adnexa tender question will fullness  Wet prep moderate clue cells, moderate white blood cell, too numerous to count bacteria  Urinalysis dipstick 1+ blood sugar of microscopic 3-10 RBC many bacteria clue cells were noted culture pending  Urine pregnancy test negative  Assessment/plan: Patient with chronic anovulation contributing to her infertility. Urine pregnancy test negative. She will be prescribed Provera 10 mg take 1 by mouth daily for 10 days of each month if she does not have a spontaneous menses every 35 days providing a home pregnancy test negative. Patient will be prescribed Flagyl 500 mg twice a day for 7 days for her bacterial vaginosis. For her bladder spasms she'll be prescribed Pyridium 200 mg one by mouth 3 times a day for 3 days as we wait for the urine culture. GC and Chlamydia culture was obtained as well.

## 2017-02-06 NOTE — Patient Instructions (Signed)
Metronidazole tablets or capsules What is this medicine? METRONIDAZOLE (me troe NI da zole) is an antiinfective. It is used to treat certain kinds of bacterial and protozoal infections. It will not work for colds, flu, or other viral infections. This medicine may be used for other purposes; ask your health care provider or pharmacist if you have questions. COMMON BRAND NAME(S): Flagyl What should I tell my health care provider before I take this medicine? They need to know if you have any of these conditions:   Medroxyprogesterone tablets What is this medicine? MEDROXYPROGESTERONE (me DROX ee proe JES te rone) is a hormone in a class called progestins. It is commonly used to prevent the uterine lining from overgrowth in women taking an estrogen after menopause. It is also used to treat irregular menstrual bleeding or a lack of menstrual bleeding in women. This medicine may be used for other purposes; ask your health care provider or pharmacist if you have questions. COMMON BRAND NAME(S): Amen, Provera What should I tell my health care provider before I take this medicine? They need to know if you have any of these conditions: -blood vessel disease or a history of a blood clot in the lungs or legs -breast, cervical or vaginal cancer -heart disease -kidney disease -liver disease -migraine -recent miscarriage or abortion -mental depression -migraine -seizures (convulsions) -stroke -vaginal bleeding that has not been evaluated -an unusual or allergic reaction to medroxyprogesterone, other medicines, foods, dyes, or preservatives -pregnant or trying to get pregnant -breast-feeding How should I use this medicine? Take this medicine by mouth with a glass of water. Follow the directions on the prescription label. Take your doses at regular intervals. Do not take your medicine more often than directed. Talk to your pediatrician regarding the use of this medicine in children. Special care may  be needed. While this drug may be prescribed for children as young as 13 years for selected conditions, precautions do apply. Overdosage: If you think you have taken too much of this medicine contact a poison control center or emergency room at once. NOTE: This medicine is only for you. Do not share this medicine with others. What if I miss a dose? If you miss a dose, take it as soon as you can. If it is almost time for your next dose, take only that dose. Do not take double or extra doses. What may interact with this medicine? -barbiturate medicines for inducing sleep or treating seizures (convulsions) -bosentan -carbamazepine -phenytoin -rifampin -St. John's Wort This list may not describe all possible interactions. Give your health care provider a list of all the medicines, herbs, non-prescription drugs, or dietary supplements you use. Also tell them if you smoke, drink alcohol, or use illegal drugs. Some items may interact with your medicine. What should I watch for while using this medicine? Visit your health care professional for regular checks on your progress. You will need a regular breast and pelvic exam. If you have any reason to think you are pregnant, stop taking this medicine at once and contact your doctor or health care professional. What side effects may I notice from receiving this medicine? Side effects that you should report to your doctor or health care professional as soon as possible: -breast tenderness or discharge -changes in mood or emotions, such as depression -changes in vision or speech -pain in the abdomen, chest, groin, or leg -severe headache -skin rash, itching, or hives -sudden shortness of breath -unusually weak or tired -yellowing of skin or eyes  Side effects that usually do not require medical attention (report to your doctor or health care professional if they continue or are bothersome): -acne -change in menstrual bleeding pattern or flow -changes  in sexual desire -facial hair growth -fluid retention and swelling -headache -upset stomach -weight gain or loss This list may not describe all possible side effects. Call your doctor for medical advice about side effects. You may report side effects to FDA at 1-800-FDA-1088. Where should I keep my medicine? Keep out of the reach of children. Store at room temperature between 20 and 25 degrees C (68 and 77 degrees F). Throw away any unused medicine after the expiration date. NOTE: This sheet is a summary. It may not cover all possible information. If you have questions about this medicine, talk to your doctor, pharmacist, or health care provider.  2018 Elsevier/Gold Standard (2008-07-21 11:26:12) Phenazopyridine tablets What is this medicine? PHENAZOPYRIDINE (fen az oh PEER i deen) is a pain reliever. It is used to stop the pain, burning, or discomfort caused by infection or irritation of the urinary tract. This medicine is not an antibiotic. It will not cure a urinary tract infection. This medicine may be used for other purposes; ask your health care provider or pharmacist if you have questions. COMMON BRAND NAME(S): AZO, Azo-100, Azo-Gesic, Azo-Septic, Azo-Standard, Phenazo, Prodium, Pyridium, Urinary Analgesic, Uristat What should I tell my health care provider before I take this medicine? They need to know if you have any of these conditions: -glucose-6-phosphate dehydrogenase (G6PD) deficiency -kidney disease -an unusual or allergic reaction to phenazopyridine, other medicines, foods, dyes, or preservatives -pregnant or trying to get pregnant -breast-feeding How should I use this medicine? Take this medicine by mouth with a glass of water. Follow the directions on the prescription label. Take after meals. Take your doses at regular intervals. Do not take your medicine more often than directed. Do not skip doses or stop your medicine early even if you feel better. Do not stop taking  except on your doctor's advice. Talk to your pediatrician regarding the use of this medicine in children. Special care may be needed. Overdosage: If you think you have taken too much of this medicine contact a poison control center or emergency room at once. NOTE: This medicine is only for you. Do not share this medicine with others. What if I miss a dose? If you miss a dose, take it as soon as you can. If it is almost time for your next dose, take only that dose. Do not take double or extra doses. What may interact with this medicine? Interactions are not expected. This list may not describe all possible interactions. Give your health care provider a list of all the medicines, herbs, non-prescription drugs, or dietary supplements you use. Also tell them if you smoke, drink alcohol, or use illegal drugs. Some items may interact with your medicine. What should I watch for while using this medicine? Tell your doctor or health care professional if your symptoms do not improve or if they get worse. This medicine colors body fluids red. This effect is harmless and will go away after you are done taking the medicine. It will change urine to an dark orange or red color. The red color may stain clothing. Soft contact lenses may become permanently stained. It is best not to wear soft contact lenses while taking this medicine. If you are diabetic you may get a false positive result for sugar in your urine. Talk to your health care  provider. What side effects may I notice from receiving this medicine? Side effects that you should report to your doctor or health care professional as soon as possible: -allergic reactions like skin rash, itching or hives, swelling of the face, lips, or tongue -blue or purple color of the skin -difficulty breathing -fever -less urine -unusual bleeding, bruising -unusual tired, weak -vomiting -yellowing of the eyes or skin Side effects that usually do not require medical  attention (report to your doctor or health care professional if they continue or are bothersome): -dark urine -headache -stomach upset This list may not describe all possible side effects. Call your doctor for medical advice about side effects. You may report side effects to FDA at 1-800-FDA-1088. Where should I keep my medicine? Keep out of the reach of children. Store at room temperature between 15 and 30 degrees C (59 and 86 degrees F). Protect from light and moisture. Throw away any unused medicine after the expiration date. NOTE: This sheet is a summary. It may not cover all possible information. If you have questions about this medicine, talk to your doctor, pharmacist, or health care provider.  2018 Elsevier/Gold Standard (2008-02-18 11:04:07)  -anemia or other blood disorders -disease of the nervous system -fungal or yeast infection -if you drink alcohol containing drinks -liver disease -seizures -an unusual or allergic reaction to metronidazole, or other medicines, foods, dyes, or preservatives -pregnant or trying to get pregnant -breast-feeding How should I use this medicine? Take this medicine by mouth with a full glass of water. Follow the directions on the prescription label. Take your medicine at regular intervals. Do not take your medicine more often than directed. Take all of your medicine as directed even if you think you are better. Do not skip doses or stop your medicine early. Talk to your pediatrician regarding the use of this medicine in children. Special care may be needed. Overdosage: If you think you have taken too much of this medicine contact a poison control center or emergency room at once. NOTE: This medicine is only for you. Do not share this medicine with others. What if I miss a dose? If you miss a dose, take it as soon as you can. If it is almost time for your next dose, take only that dose. Do not take double or extra doses. What may interact with this  medicine? Do not take this medicine with any of the following medications: -alcohol or any product that contains alcohol -amprenavir oral solution -cisapride -disulfiram -dofetilide -dronedarone -paclitaxel injection -pimozide -ritonavir oral solution -sertraline oral solution -sulfamethoxazole-trimethoprim injection -thioridazine -ziprasidone This medicine may also interact with the following medications: -birth control pills -cimetidine -lithium -other medicines that prolong the QT interval (cause an abnormal heart rhythm) -phenobarbital -phenytoin -warfarin This list may not describe all possible interactions. Give your health care provider a list of all the medicines, herbs, non-prescription drugs, or dietary supplements you use. Also tell them if you smoke, drink alcohol, or use illegal drugs. Some items may interact with your medicine. What should I watch for while using this medicine? Tell your doctor or health care professional if your symptoms do not improve or if they get worse. You may get drowsy or dizzy. Do not drive, use machinery, or do anything that needs mental alertness until you know how this medicine affects you. Do not stand or sit up quickly, especially if you are an older patient. This reduces the risk of dizzy or fainting spells. Avoid alcoholic drinks while you  are taking this medicine and for three days afterward. Alcohol may make you feel dizzy, sick, or flushed. If you are being treated for a sexually transmitted disease, avoid sexual contact until you have finished your treatment. Your sexual partner may also need treatment. What side effects may I notice from receiving this medicine? Side effects that you should report to your doctor or health care professional as soon as possible: -allergic reactions like skin rash or hives, swelling of the face, lips, or tongue -confusion, clumsiness -difficulty speaking -discolored or sore mouth -dizziness -fever,  infection -numbness, tingling, pain or weakness in the hands or feet -trouble passing urine or change in the amount of urine -redness, blistering, peeling or loosening of the skin, including inside the mouth -seizures -unusually weak or tired -vaginal irritation, dryness, or discharge Side effects that usually do not require medical attention (report to your doctor or health care professional if they continue or are bothersome): -diarrhea -headache -irritability -metallic taste -nausea -stomach pain or cramps -trouble sleeping This list may not describe all possible side effects. Call your doctor for medical advice about side effects. You may report side effects to FDA at 1-800-FDA-1088. Where should I keep my medicine? Keep out of the reach of children. Store at room temperature below 25 degrees C (77 degrees F). Protect from light. Keep container tightly closed. Throw away any unused medicine after the expiration date. NOTE: This sheet is a summary. It may not cover all possible information. If you have questions about this medicine, talk to your doctor, pharmacist, or health care provider.  2018 Elsevier/Gold Standard (2013-02-26 14:08:39) Bacterial Vaginosis Bacterial vaginosis is a vaginal infection that occurs when the normal balance of bacteria in the vagina is disrupted. It results from an overgrowth of certain bacteria. This is the most common vaginal infection among women ages 52-44. Because bacterial vaginosis increases your risk for STIs (sexually transmitted infections), getting treated can help reduce your risk for chlamydia, gonorrhea, herpes, and HIV (human immunodeficiency virus). Treatment is also important for preventing complications in pregnant women, because this condition can cause an early (premature) delivery. What are the causes? This condition is caused by an increase in harmful bacteria that are normally present in small amounts in the vagina. However, the reason  that the condition develops is not fully understood. What increases the risk? The following factors may make you more likely to develop this condition:  Having a new sexual partner or multiple sexual partners.  Having unprotected sex.  Douching.  Having an intrauterine device (IUD).  Smoking.  Drug and alcohol abuse.  Taking certain antibiotic medicines.  Being pregnant.  You cannot get bacterial vaginosis from toilet seats, bedding, swimming pools, or contact with objects around you. What are the signs or symptoms? Symptoms of this condition include:  Grey or white vaginal discharge. The discharge can also be watery or foamy.  A fish-like odor with discharge, especially after sexual intercourse or during menstruation.  Itching in and around the vagina.  Burning or pain with urination.  Some women with bacterial vaginosis have no signs or symptoms. How is this diagnosed? This condition is diagnosed based on:  Your medical history.  A physical exam of the vagina.  Testing a sample of vaginal fluid under a microscope to look for a large amount of bad bacteria or abnormal cells. Your health care provider may use a cotton swab or a small wooden spatula to collect the sample.  How is this treated? This condition is  treated with antibiotics. These may be given as a pill, a vaginal cream, or a medicine that is put into the vagina (suppository). If the condition comes back after treatment, a second round of antibiotics may be needed. Follow these instructions at home: Medicines  Take over-the-counter and prescription medicines only as told by your health care provider.  Take or use your antibiotic as told by your health care provider. Do not stop taking or using the antibiotic even if you start to feel better. General instructions  If you have a female sexual partner, tell her that you have a vaginal infection. She should see her health care provider and be treated if she  has symptoms. If you have a female sexual partner, he does not need treatment.  During treatment: ? Avoid sexual activity until you finish treatment. ? Do not douche. ? Avoid alcohol as directed by your health care provider. ? Avoid breastfeeding as directed by your health care provider.  Drink enough water and fluids to keep your urine clear or pale yellow.  Keep the area around your vagina and rectum clean. ? Wash the area daily with warm water. ? Wipe yourself from front to back after using the toilet.  Keep all follow-up visits as told by your health care provider. This is important. How is this prevented?  Do not douche.  Wash the outside of your vagina with warm water only.  Use protection when having sex. This includes latex condoms and dental dams.  Limit how many sexual partners you have. To help prevent bacterial vaginosis, it is best to have sex with just one partner (monogamous).  Make sure you and your sexual partner are tested for STIs.  Wear cotton or cotton-lined underwear.  Avoid wearing tight pants and pantyhose, especially during summer.  Limit the amount of alcohol that you drink.  Do not use any products that contain nicotine or tobacco, such as cigarettes and e-cigarettes. If you need help quitting, ask your health care provider.  Do not use illegal drugs. Where to find more information:  Centers for Disease Control and Prevention: SolutionApps.co.zawww.cdc.gov/std  American Sexual Health Association (ASHA): www.ashastd.org  U.S. Department of Health and Health and safety inspectorHuman Services, Office on Women's Health: ConventionalMedicines.siwww.womenshealth.gov/ or http://www.anderson-williamson.info/https://www.womenshealth.gov/a-z-topics/bacterial-vaginosis Contact a health care provider if:  Your symptoms do not improve, even after treatment.  You have more discharge or pain when urinating.  You have a fever.  You have pain in your abdomen.  You have pain during sex.  You have vaginal bleeding between periods. Summary  Bacterial  vaginosis is a vaginal infection that occurs when the normal balance of bacteria in the vagina is disrupted.  Because bacterial vaginosis increases your risk for STIs (sexually transmitted infections), getting treated can help reduce your risk for chlamydia, gonorrhea, herpes, and HIV (human immunodeficiency virus). Treatment is also important for preventing complications in pregnant women, because the condition can cause an early (premature) delivery.  This condition is treated with antibiotic medicines. These may be given as a pill, a vaginal cream, or a medicine that is put into the vagina (suppository). This information is not intended to replace advice given to you by your health care provider. Make sure you discuss any questions you have with your health care provider. Document Released: 07/22/2005 Document Revised: 04/06/2016 Document Reviewed: 04/06/2016 Elsevier Interactive Patient Education  2017 [employer]

## 2017-02-07 LAB — GC/CHLAMYDIA PROBE AMP
CT Probe RNA: NOT DETECTED
GC Probe RNA: NOT DETECTED

## 2017-02-07 LAB — URINE CULTURE: ORGANISM ID, BACTERIA: NO GROWTH

## 2017-02-10 ENCOUNTER — Other Ambulatory Visit: Payer: Self-pay | Admitting: Gynecology

## 2017-02-10 DIAGNOSIS — R3129 Other microscopic hematuria: Secondary | ICD-10-CM

## 2017-02-19 ENCOUNTER — Ambulatory Visit (INDEPENDENT_AMBULATORY_CARE_PROVIDER_SITE_OTHER): Payer: BLUE CROSS/BLUE SHIELD

## 2017-02-19 ENCOUNTER — Encounter: Payer: Self-pay | Admitting: Gynecology

## 2017-02-19 ENCOUNTER — Ambulatory Visit (INDEPENDENT_AMBULATORY_CARE_PROVIDER_SITE_OTHER): Payer: BLUE CROSS/BLUE SHIELD | Admitting: Gynecology

## 2017-02-19 VITALS — BP 124/80

## 2017-02-19 DIAGNOSIS — R102 Pelvic and perineal pain: Secondary | ICD-10-CM | POA: Diagnosis not present

## 2017-02-19 DIAGNOSIS — Z8742 Personal history of other diseases of the female genital tract: Secondary | ICD-10-CM | POA: Diagnosis not present

## 2017-02-19 NOTE — Progress Notes (Signed)
   Patient is a 26 year old with history of chronic anovulation and infertility has been followed by reproductive endocrinologist and last year had been on Femera 9 had been seen in the office on July 5 without having had a menstrual cycle since April 5. She had not been using any form of contraception. She denied any nausea vomiting nipple discharge or any visual disturbances or headaches. She had a negative urine pregnancy test done in the office which was negative and she was also complaining of vaginal discharge whereby she was noted to have evidence of bacterial vaginosis and was started on Flagyl 500 mg twice a day for 7 days. For her bladder spasm she was started on preterm 200 mg 3 times a day for 3 days as we waited for the urine culture. She also had a GC and Chlamydia culture obtained at that time. Her GC and chlamydia culture were negative and her urine culture was negative. She stated that she began bleeding a few days ago since she had been started on the Provera.  Ultrasound today: Uterus measures 7.7 x 4.9 x 3.6 cm with endometrial stripe of 9.8 millimeter. 82+ follicles were noted on each ovary consistent with PCO as. No free fluid in the cul-de-sac.  Assessment/plan: Patient with history of PCOS/ chronic anovulation will make arrangements to follow-up with the reproductive endocrinologist the next few weeks. In the meantime I've given her prescription of Provera 10 mg to take 1 by mouth daily for 10 days of the month if she does not have a spontaneous menses every 35 days provided that she doesn't help her test that is negative. I've recommended she continue with her prenatal vitamins daily. We also discussed with her and provided instructions on timing of intercourse with utilization of the ovulation predictor kit.

## 2017-02-21 ENCOUNTER — Other Ambulatory Visit: Payer: Self-pay | Admitting: Women's Health

## 2017-02-21 DIAGNOSIS — E282 Polycystic ovarian syndrome: Secondary | ICD-10-CM

## 2017-04-16 ENCOUNTER — Other Ambulatory Visit: Payer: Self-pay | Admitting: Women's Health

## 2017-04-16 DIAGNOSIS — N760 Acute vaginitis: Secondary | ICD-10-CM

## 2017-04-16 DIAGNOSIS — N898 Other specified noninflammatory disorders of vagina: Secondary | ICD-10-CM

## 2017-04-16 DIAGNOSIS — B9689 Other specified bacterial agents as the cause of diseases classified elsewhere: Secondary | ICD-10-CM

## 2017-06-25 ENCOUNTER — Ambulatory Visit (INDEPENDENT_AMBULATORY_CARE_PROVIDER_SITE_OTHER): Payer: BLUE CROSS/BLUE SHIELD | Admitting: Women's Health

## 2017-06-25 ENCOUNTER — Encounter: Payer: Self-pay | Admitting: Women's Health

## 2017-06-25 VITALS — BP 124/80 | Ht 63.0 in | Wt 206.0 lb

## 2017-06-25 DIAGNOSIS — N926 Irregular menstruation, unspecified: Secondary | ICD-10-CM | POA: Diagnosis not present

## 2017-06-25 DIAGNOSIS — Z01419 Encounter for gynecological examination (general) (routine) without abnormal findings: Secondary | ICD-10-CM

## 2017-06-25 DIAGNOSIS — E282 Polycystic ovarian syndrome: Secondary | ICD-10-CM

## 2017-06-25 LAB — CBC WITH DIFFERENTIAL/PLATELET
BASOS ABS: 26 {cells}/uL (ref 0–200)
Basophils Relative: 0.3 %
EOS PCT: 0.5 %
Eosinophils Absolute: 44 cells/uL (ref 15–500)
HEMATOCRIT: 38.7 % (ref 35.0–45.0)
Hemoglobin: 13.1 g/dL (ref 11.7–15.5)
LYMPHS ABS: 3661 {cells}/uL (ref 850–3900)
MCH: 28.9 pg (ref 27.0–33.0)
MCHC: 33.9 g/dL (ref 32.0–36.0)
MCV: 85.4 fL (ref 80.0–100.0)
MPV: 10.1 fL (ref 7.5–12.5)
Monocytes Relative: 7.4 %
NEUTROS PCT: 50.2 %
Neutro Abs: 4418 cells/uL (ref 1500–7800)
PLATELETS: 309 10*3/uL (ref 140–400)
RBC: 4.53 10*6/uL (ref 3.80–5.10)
RDW: 13.3 % (ref 11.0–15.0)
TOTAL LYMPHOCYTE: 41.6 %
WBC mixed population: 651 cells/uL (ref 200–950)
WBC: 8.8 10*3/uL (ref 3.8–10.8)

## 2017-06-25 LAB — GLUCOSE, RANDOM: Glucose, Bld: 82 mg/dL (ref 65–99)

## 2017-06-25 LAB — TSH: TSH: 2.17 mIU/L

## 2017-06-25 MED ORDER — METFORMIN HCL 500 MG PO TABS: 500.0000 mg | ORAL_TABLET | Freq: Two times a day (BID) | ORAL | 12 refills | Status: DC

## 2017-06-25 MED ORDER — MEDROXYPROGESTERONE ACETATE 10 MG PO TABS: ORAL_TABLET | ORAL | 4 refills | Status: DC

## 2017-06-25 NOTE — Progress Notes (Signed)
Laura BruinsCarlise T Huang 12-24-1990 [id number]    History:    Presents for annual exam. Irregular cycles, history of normal TSH and prolactin desiring conception. PCOS, metformin in the past stopped due to upset stomach. Has seen a fertility specialist and [place] and Dr. April MansonYalcinkaya locally. States cycles did become more regular after  weight loss but has regained. 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 Gardasil completed. Taking Provera 10 mg for 5 days if no cycle after a month and negative home UPT.  Past medical history, past surgical history, family history and social history were all reviewed and documented in the EPIC chart. Engaged planning to marry in April. 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  Exam:  Vitals:   06/25/17 1538  BP: 124/80  Weight: 206 lb (93.4 kg)  Height: 5\' 3"  (1.6 m)   Body mass index is 36.49 kg/m.   General appearance:  Normal Thyroid:  Symmetrical, normal in size, without palpable masses or nodularity. Respiratory  Auscultation:  Clear without wheezing or rhonchi Cardiovascular  Auscultation:  Regular rate, without rubs, murmurs or gallops  Edema/varicosities:  Not grossly evident Abdominal  Soft,nontender, without masses, guarding or rebound.  Liver/spleen:  No organomegaly noted  Hernia:  None appreciated  Skin  Inspection:  Grossly normal   Breasts: Examined lying and sitting.     Right: Without masses, retractions, discharge or axillary adenopathy.     Left: Without masses, retractions, discharge or axillary adenopathy. Gentitourinary   Inguinal/mons:  Normal without inguinal adenopathy  External genitalia:  Normal  BUS/Urethra/Skene's glands:  Normal  Vagina:  Normal  Cervix:  Normal  Uterus:   normal in size, shape and contour.  Midline and mobile  Adnexa/parametria:     Rt: Without masses or tenderness.   Lt: Without masses or  tenderness.  Anus and perineum: Normal  Digital rectal exam: Normal sphincter tone without palpated masses or tenderness  Assessment/Plan:  26 y.o. SBF G1 P0 for annual exam.   Irregular cycles desiring conception. PCO S Obesity 2011 CIN 2 LEEP procedure normal Paps after Smoker half pack daily  Plan: Long discussion regarding lifestyle, need for weight loss, no smoking in regards to fertility management. Metformin 500 mg will start with half tablet daily and gradually increase to twice daily. Provera 10 mg by mouth for 5 days if no cycle and negative home UPT. Reviewed importance of frequency of intercourse. Prenatal vitamin daily encouraged, safe pregnancy behaviors discussed. SBE's, exercise, calcium rich diet encouraged.  CBC, TSH, glucose, Pap.    Harrington Challengerancy J Young [facility]NP, 4:51 PM 06/25/2017

## 2017-06-25 NOTE — Progress Notes (Signed)
lab02

## 2017-06-25 NOTE — Patient Instructions (Signed)
Carbohydrate Counting for Diabetes Mellitus, Adult Carbohydrate counting is a method for keeping track of how many carbohydrates you eat. Eating carbohydrates naturally increases the amount of sugar (glucose) in the blood. Counting how many carbohydrates you eat helps keep your blood glucose within normal limits, which helps you manage your diabetes (diabetes mellitus). It is important to know how many carbohydrates you can safely have in each meal. This is different for every person. A diet and nutrition specialist (registered dietitian) can help you make a meal plan and calculate how many carbohydrates you should have at each meal and snack. Carbohydrates are found in the following foods:  Grains, such as breads and cereals.  Dried beans and soy products.  Starchy vegetables, such as potatoes, peas, and corn.  Fruit and fruit juices.  Milk and yogurt.  Sweets and snack foods, such as cake, cookies, candy, chips, and soft drinks.  How do I count carbohydrates? There are two ways to count carbohydrates in food. You can use either of the methods or a combination of both. Reading "Nutrition Facts" on packaged food The "Nutrition Facts" list is included on the labels of almost all packaged foods and beverages in the [place] It includes:  The serving size.  Information about nutrients in each serving, including the grams (g) of carbohydrate per serving.  To use the "Nutrition Facts":  Decide how many servings you will have.  Multiply the number of servings by the number of carbohydrates per serving.  The resulting number is the total amount of carbohydrates that you will be having.  Learning standard serving sizes of other foods When you eat foods containing carbohydrates that are not packaged or do not include "Nutrition Facts" on the label, you need to measure the servings in order to count the amount of carbohydrates:  Measure the foods that you will eat with a food scale or  measuring cup, if needed.  Decide how many standard-size servings you will eat.  Multiply the number of servings by 15. Most carbohydrate-rich foods have about 15 g of carbohydrates per serving. ? For example, if you eat 8 oz (170 g) of strawberries, you will have eaten 2 servings and 30 g of carbohydrates (2 servings x 15 g = 30 g).  For foods that have more than one food mixed, such as soups and casseroles, you must count the carbohydrates in each food that is included.  The following list contains standard serving sizes of common carbohydrate-rich foods. Each of these servings has about 15 g of carbohydrates:   hamburger bun or  English muffin.   oz (15 mL) syrup.   oz (14 g) jelly.  1 slice of bread.  1 six-inch tortilla.  3 oz (85 g) cooked rice or pasta.  4 oz (113 g) cooked dried beans.  4 oz (113 g) starchy vegetable, such as peas, corn, or potatoes.  4 oz (113 g) hot cereal.  4 oz (113 g) mashed potatoes or  of a large baked potato.  4 oz (113 g) canned or frozen fruit.  4 oz (120 mL) fruit juice.  4-6 crackers.  6 chicken nuggets.  6 oz (170 g) unsweetened dry cereal.  6 oz (170 g) plain fat-free yogurt or yogurt sweetened with artificial sweeteners.  8 oz (240 mL) milk.  8 oz (170 g) fresh fruit or one small piece of fruit.  24 oz (680 g) popped popcorn.  Example of carbohydrate counting Sample meal  3 oz (85 g) chicken breast.    6 oz (170 g) brown rice.  4 oz (113 g) corn.  8 oz (240 mL) milk.  8 oz (170 g) strawberries with sugar-free whipped topping. Carbohydrate calculation 1. Identify the foods that contain carbohydrates: ? Rice. ? Corn. ? Milk. ? Strawberries. 2. Calculate how many servings you have of each food: ? 2 servings rice. ? 1 serving corn. ? 1 serving milk. ? 1 serving strawberries. 3. Multiply each number of servings by 15 g: ? 2 servings rice x 15 g = 30 g. ? 1 serving corn x 15 g = 15 g. ? 1 serving milk x 15  g = 15 g. ? 1 serving strawberries x 15 g = 15 g. 4. Add together all of the amounts to find the total grams of carbohydrates eaten: ? 30 g + 15 g + 15 g + 15 g = 75 g of carbohydrates total. This information is not intended to replace advice given to you by your health care provider. Make sure you discuss any questions you have with your health care provider. Document Released: 07/22/2005 Document Revised: 02/09/2016 Document Reviewed: 01/03/2016 Elsevier Interactive Patient Education  2018 [place] Maintenance, Female Adopting a healthy lifestyle and getting preventive care can go a long way to promote health and wellness. Talk with your health care provider about what schedule of regular examinations is right for you. This is a good chance for you to check in with your provider about disease prevention and staying healthy. In between checkups, there are plenty of things you can do on your own. Experts have done a lot of research about which lifestyle changes and preventive measures are most likely to keep you healthy. Ask your health care provider for more information. Weight and diet Eat a healthy diet  Be sure to include plenty of vegetables, fruits, low-fat dairy products, and lean protein.  Do not eat a lot of foods high in solid fats, added sugars, or salt.  Get regular exercise. This is one of the most important things you can do for your health. ? Most adults should exercise for at least 150 minutes each week. The exercise should increase your heart rate and make you sweat (moderate-intensity exercise). ? Most adults should also do strengthening exercises at least twice a week. This is in addition to the moderate-intensity exercise.  Maintain a healthy weight  Body mass index (BMI) is a measurement that can be used to identify possible weight problems. It estimates body fat based on height and weight. Your health care provider can help determine your BMI and help you  achieve or maintain a healthy weight.  For females 76 years of age and older: ? A BMI below 18.5 is considered underweight. ? A BMI of 18.5 to 24.9 is normal. ? A BMI of 25 to 29.9 is considered overweight. ? A BMI of 30 and above is considered obese.  Watch levels of cholesterol and blood lipids  You should start having your blood tested for lipids and cholesterol at 27 years of age, then have this test every 5 years.  You may need to have your cholesterol levels checked more often if: ? Your lipid or cholesterol levels are high. ? You are older than 27 years of age. ? You are at high risk for heart disease.  Cancer screening Lung Cancer  Lung cancer screening is recommended for adults 52-25 years old who are at high risk for lung cancer because of a history of smoking.  A  yearly low-dose CT scan of the lungs is recommended for people who: ? Currently smoke. ? Have quit within the past 15 years. ? Have at least a 30-pack-year history of smoking. A pack year is smoking an average of one pack of cigarettes a day for 1 year.  Yearly screening should continue until it has been 15 years since you quit.  Yearly screening should stop if you develop a health problem that would prevent you from having lung cancer treatment.  Breast Cancer  Practice breast self-awareness. This means understanding how your breasts normally appear and feel.  It also means doing regular breast self-exams. Let your health care provider know about any changes, no matter how small.  If you are in your 20s or 30s, you should have a clinical breast exam (CBE) by a health care provider every 1-3 years as part of a regular health exam.  If you are 4 or older, have a CBE every year. Also consider having a breast X-ray (mammogram) every year.  If you have a family history of breast cancer, talk to your health care provider about genetic screening.  If you are at high risk for breast cancer, talk to your health  care provider about having an MRI and a mammogram every year.  Breast cancer gene (BRCA) assessment is recommended for women who have family members with BRCA-related cancers. BRCA-related cancers include: ? Breast. ? Ovarian. ? Tubal. ? Peritoneal cancers.  Results of the assessment will determine the need for genetic counseling and BRCA1 and BRCA2 testing.  Cervical Cancer Your health care provider may recommend that you be screened regularly for cancer of the pelvic organs (ovaries, uterus, and vagina). This screening involves a pelvic examination, including checking for microscopic changes to the surface of your cervix (Pap test). You may be encouraged to have this screening done every 3 years, beginning at age 12.  For women ages 13-65, health care providers may recommend pelvic exams and Pap testing every 3 years, or they may recommend the Pap and pelvic exam, combined with testing for human papilloma virus (HPV), every 5 years. Some types of HPV increase your risk of cervical cancer. Testing for HPV may also be done on women of any age with unclear Pap test results.  Other health care providers may not recommend any screening for nonpregnant women who are considered low risk for pelvic cancer and who do not have symptoms. Ask your health care provider if a screening pelvic exam is right for you.  If you have had past treatment for cervical cancer or a condition that could lead to cancer, you need Pap tests and screening for cancer for at least 20 years after your treatment. If Pap tests have been discontinued, your risk factors (such as having a new sexual partner) need to be reassessed to determine if screening should resume. Some women have medical problems that increase the chance of getting cervical cancer. In these cases, your health care provider may recommend more frequent screening and Pap tests.  Colorectal Cancer  This type of cancer can be detected and often  prevented.  Routine colorectal cancer screening usually begins at 27 years of age and continues through 27 years of age.  Your health care provider may recommend screening at an earlier age if you have risk factors for colon cancer.  Your health care provider may also recommend using home test kits to check for hidden blood in the stool.  A small camera at the end of  a tube can be used to examine your colon directly (sigmoidoscopy or colonoscopy). This is done to check for the earliest forms of colorectal cancer.  Routine screening usually begins at age 45.  Direct examination of the colon should be repeated every 5-10 years through 27 years of age. However, you may need to be screened more often if early forms of precancerous polyps or small growths are found.  Skin Cancer  Check your skin from head to toe regularly.  Tell your health care provider about any new moles or changes in moles, especially if there is a change in a mole's shape or color.  Also tell your health care provider if you have a mole that is larger than the size of a pencil eraser.  Always use sunscreen. Apply sunscreen liberally and repeatedly throughout the day.  Protect yourself by wearing long sleeves, pants, a wide-brimmed hat, and sunglasses whenever you are outside.  Heart disease, diabetes, and high blood pressure  High blood pressure causes heart disease and increases the risk of stroke. High blood pressure is more likely to develop in: ? People who have blood pressure in the high end of the normal range (130-139/85-89 mm Hg). ? People who are overweight or obese. ? People who are African American.  If you are 65-44 years of age, have your blood pressure checked every 3-5 years. If you are 35 years of age or older, have your blood pressure checked every year. You should have your blood pressure measured twice-once when you are at a hospital or clinic, and once when you are not at a hospital or clinic.  Record the average of the two measurements. To check your blood pressure when you are not at a hospital or clinic, you can use: ? An automated blood pressure machine at a pharmacy. ? A home blood pressure monitor.  If you are between 55 years and 4 years old, ask your health care provider if you should take aspirin to prevent strokes.  Have regular diabetes screenings. This involves taking a blood sample to check your fasting blood sugar level. ? If you are at a normal weight and have a low risk for diabetes, have this test once every three years after 27 years of age. ? If you are overweight and have a high risk for diabetes, consider being tested at a younger age or more often. Preventing infection Hepatitis B  If you have a higher risk for hepatitis B, you should be screened for this virus. You are considered at high risk for hepatitis B if: ? You were born in a country where hepatitis B is common. Ask your health care provider which countries are considered high risk. ? Your parents were born in a high-risk country, and you have not been immunized against hepatitis B (hepatitis B vaccine). ? You have HIV or AIDS. ? You use needles to inject street drugs. ? You live with someone who has hepatitis B. ? You have had sex with someone who has hepatitis B. ? You get hemodialysis treatment. ? You take certain medicines for conditions, including cancer, organ transplantation, and autoimmune conditions.  Hepatitis C  Blood testing is recommended for: ? Everyone born from [birth year] through 1966. ? Anyone with known risk factors for hepatitis C.  Sexually transmitted infections (STIs)  You should be screened for sexually transmitted infections (STIs) including gonorrhea and chlamydia if: ? You are sexually active and are younger than 27 years of age. ? You are older than  27 years of age and your health care provider tells you that you are at risk for this type of infection. ? Your sexual  activity has changed since you were last screened and you are at an increased risk for chlamydia or gonorrhea. Ask your health care provider if you are at risk.  If you do not have HIV, but are at risk, it may be recommended that you take a prescription medicine daily to prevent HIV infection. This is called pre-exposure prophylaxis (PrEP). You are considered at risk if: ? You are sexually active and do not regularly use condoms or know the HIV status of your partner(s). ? You take drugs by injection. ? You are sexually active with a partner who has HIV.  Talk with your health care provider about whether you are at high risk of being infected with HIV. If you choose to begin PrEP, you should first be tested for HIV. You should then be tested every 3 months for as long as you are taking PrEP. Pregnancy  If you are premenopausal and you may become pregnant, ask your health care provider about preconception counseling.  If you may become pregnant, take 400 to 800 micrograms (mcg) of folic acid every day.  If you want to prevent pregnancy, talk to your health care provider about birth control (contraception). Osteoporosis and menopause  Osteoporosis is a disease in which the bones lose minerals and strength with aging. This can result in serious bone fractures. Your risk for osteoporosis can be identified using a bone density scan.  If you are 29 years of age or older, or if you are at risk for osteoporosis and fractures, ask your health care provider if you should be screened.  Ask your health care provider whether you should take a calcium or vitamin D supplement to lower your risk for osteoporosis.  Menopause may have certain physical symptoms and risks.  Hormone replacement therapy may reduce some of these symptoms and risks. Talk to your health care provider about whether hormone replacement therapy is right for you. Follow these instructions at home:  Schedule regular health, dental,  and eye exams.  Stay current with your immunizations.  Do not use any tobacco products including cigarettes, chewing tobacco, or electronic cigarettes.  If you are pregnant, do not drink alcohol.  If you are breastfeeding, limit how much and how often you drink alcohol.  Limit alcohol intake to no more than 1 drink per day for nonpregnant women. One drink equals 12 ounces of beer, 5 ounces of wine, or 1 ounces of hard liquor.  Do not use street drugs.  Do not share needles.  Ask your health care provider for help if you need support or information about quitting drugs.  Tell your health care provider if you often feel depressed.  Tell your health care provider if you have ever been abused or do not feel safe at home. This information is not intended to replace advice given to you by your health care provider. Make sure you discuss any questions you have with your health care provider. Document Released: 02/04/2011 Document Revised: 12/28/2015 Document Reviewed: 04/25/2015 Elsevier Interactive Patient Education  [employer].

## 2017-06-26 ENCOUNTER — Encounter: Payer: Self-pay | Admitting: Women's Health

## 2017-07-01 LAB — PAP IG W/ RFLX HPV ASCU

## 2020-09-28 ENCOUNTER — Other Ambulatory Visit: Payer: Self-pay

## 2020-09-28 ENCOUNTER — Other Ambulatory Visit (HOSPITAL_COMMUNITY)
Admission: RE | Admit: 2020-09-28 | Discharge: 2020-09-28 | Disposition: A | Discharge status: Home / Self Care | Payer: Self-pay | Source: Ambulatory Visit | Attending: Nurse Practitioner | Admitting: Nurse Practitioner

## 2020-09-28 ENCOUNTER — Ambulatory Visit (INDEPENDENT_AMBULATORY_CARE_PROVIDER_SITE_OTHER): Payer: Managed Care, Other (non HMO) | Admitting: Nurse Practitioner

## 2020-09-28 ENCOUNTER — Encounter: Payer: Self-pay | Admitting: Nurse Practitioner

## 2020-09-28 VITALS — BP 110/74 | HR 70 | Resp 16 | Ht 62.0 in | Wt 220.0 lb

## 2020-09-28 DIAGNOSIS — Z01419 Encounter for gynecological examination (general) (routine) without abnormal findings: Secondary | ICD-10-CM

## 2020-09-28 DIAGNOSIS — N926 Irregular menstruation, unspecified: Secondary | ICD-10-CM

## 2020-09-28 DIAGNOSIS — R350 Frequency of micturition: Secondary | ICD-10-CM | POA: Diagnosis not present

## 2020-09-28 LAB — URINALYSIS, COMPLETE W/RFL CULTURE: Nitrites, Initial: NEGATIVE

## 2020-09-28 LAB — PREGNANCY, URINE: Preg Test, Ur: NEGATIVE

## 2020-09-28 MED ORDER — MEDROXYPROGESTERONE ACETATE 10 MG PO TABS: 10.0000 mg | ORAL_TABLET | Freq: Every day | ORAL | 3 refills | Status: AC

## 2020-09-28 NOTE — Progress Notes (Signed)
30 y.o. G63P0010 Married [place] American female here for annual exam.     Always had irregular cycles, wantsto be pregnant right now. Has been to a couple infertility specialists. Was going through treatments early 2021. Responded well to letrazole in past and ovulates with that. Has tried IUI, right now does not have the time. Last [place] was about 1 year ago. Wants to focus on weight loss.  Periods are irregular. Had mostly regular periods with infertility treatments. Stopped around March 2021 and did not have a cycle again until December 2021, had a cycle on her own. Started spotting Jan 27, then through out the last month has spotted randomly off and on through out the month. Spotting now.  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 Has gained so much weight and is trying so hard to lose.  No LMP recorded. (Menstrual status: Irregular Periods).          Sexually active: Yes.    The current method of family planning is none.    Exercising: No.  exercise Smoker:  yes  Health Maintenance: Pap:  06-25-17 neg History of abnormal Pap:  yes MMG:  none Colonoscopy:  none BMD:   none TDaP:  2021 Gardasil:   completed Covid-19: pfizer Hep C testing: neg 2015 Screening Labs: Will drawn A1C and TSH today   reports that she has been smoking cigarettes. She has never used smokeless tobacco. She reports previous alcohol use. She reports that she does not use drugs.  Past Medical History:  Diagnosis Date  . Anxiety   . Chlamydia 04/21/08   positive test  . CIN II (cervical intraepithelial neoplasia II)   . Infertility, female   . LGSIL (low grade squamous intraepithelial dysplasia)    ON [place], TREATED WITH ALDARA  . PCOS (polycystic ovarian syndrome)   . Substance abuse (HCC)    chlamydia treated yrs ago    Past Surgical History:  Procedure Laterality Date  . CERVICAL BIOPSY  W/ LOOP ELECTRODE EXCISION  [id number]   . FINGER SURGERY     pinky  . THERAPEUTIC ABORTION      Current Outpatient Medications  Medication Sig Dispense Refill  . IBUPROFEN PO Take by mouth.    . COLLAGEN PO Take by mouth. (Patient not taking: Reported on 09/28/2020)    . Multiple Vitamin (MULTIVITAMIN PO) Take by mouth. (Patient not taking: Reported on 09/28/2020)    . Omega-3 Fatty Acids (OMEGA 3 PO) Take by mouth. (Patient not taking: Reported on 09/28/2020)    . Probiotic Product (PROBIOTIC PO) Take by mouth. (Patient not taking: Reported on 09/28/2020)     No current facility-administered medications for this visit.    Family History  Problem Relation Age of Onset  . Hypertension Mother     Review of Systems  Constitutional: Negative.   HENT: Negative.   Eyes: Negative.   Respiratory: Negative.   Cardiovascular: Negative.   Gastrointestinal: Negative.   Endocrine: Negative.   Genitourinary:       Irregular cycle, urinary frequency, pcos, unable to lose weight, fatigue  Musculoskeletal: Negative.   Skin: Negative.   Allergic/Immunologic: Negative.   Neurological: Negative.   Hematological: Negative.   Psychiatric/Behavioral: Negative.     Exam:   BP 110/74   Pulse 70   Resp 16   Ht 5\' 2"  (1.575 m)   Wt 220 lb (99.8 kg)   BMI 40.24 kg/m  Height: 5\' 2"  (157.5 cm)  General appearance: alert, cooperative and appears stated age, no acute distress Head: Normocephalic, without obvious abnormality Neck: no adenopathy, thyroid normal to inspection and palpation Lungs: clear to auscultation bilaterally Breasts: Normal to palpation without dominant masses, Taught monthly breast self examination Heart: regular rate and rhythm Abdomen: soft, non-tender; no masses,  no organomegaly Extremities: extremities normal, no edema Skin: No rashes or lesions Lymph nodes: Cervical, supraclavicular, and axillary nodes normal. No abnormal inguinal nodes palpated Neurologic: Grossly normal   Pelvic: External genitalia:   no lesions              Urethra:  normal appearing urethra with no masses, tenderness or lesions              Bartholins and Skenes: normal                 Vagina: normal appearing vagina, appropriate for age, normal appearing discharge, no lesions              Cervix: neg cervical motion tenderness, no visible lesions             Bimanual Exam:   Uterus:  normal size, contour, position, consistency, mobility, non-tender              Adnexa: no mass, fullness, tenderness                 Joy, CMA Chaperone was present for exam.  A:  Well Woman with normal exam  Polycystic Ovarian Syndrome  Urinary frequency - Plan: Urinalysis,Complete w/RFL Culture, Hemoglobin A1c  Well woman exam with routine gynecological exam - Plan: Cytology - PAP( [facility])  Irregular menstrual bleeding - Plan: Pregnancy, urine, PELVIS TRANSVAGINAL NON-OB (TV ONLY), medroxyPROGESTERone (PROVERA) 10 MG tablet, TSH, Hemoglobin A1c  P:   Pap : cotesting today (Hx Leep)  Labs: TSH, A1c  Medications: Provera 10 mg x 10 days, 3 RF. Advised to take anytime goes longer than 3 months with out a period.  Pt to return for pelvic US for spotting x 1 month and tenderness on exam  Discussed weight loss. Advised against diet aide at this time, emphasized diet and exercise is best for sustainable weight loss. Discussed weight watchers. Can consider OTC Alli.

## 2020-09-28 NOTE — Patient Instructions (Addendum)
Weight loss medication OTC: Alli A great weight loss program: Weight watchers   Health Maintenance, Female Adopting a healthy lifestyle and getting preventive care are important in promoting health and wellness. Ask your health care provider about:  The right schedule for you to have regular tests and exams.  Things you can do on your own to prevent diseases and keep yourself healthy. What should I know about diet, weight, and exercise? Eat a healthy diet  Eat a diet that includes plenty of vegetables, fruits, low-fat dairy products, and lean protein.  Do not eat a lot of foods that are high in solid fats, added sugars, or sodium.   Maintain a healthy weight Body mass index (BMI) is used to identify weight problems. It estimates body fat based on height and weight. Your health care provider can help determine your BMI and help you achieve or maintain a healthy weight. Get regular exercise Get regular exercise. This is one of the most important things you can do for your health. Most adults should:  Exercise for at least 150 minutes each week. The exercise should increase your heart rate and make you sweat (moderate-intensity exercise).  Do strengthening exercises at least twice a week. This is in addition to the moderate-intensity exercise.  Spend less time sitting. Even light physical activity can be beneficial. Watch cholesterol and blood lipids Have your blood tested for lipids and cholesterol at 31 years of age, then have this test every 5 years. Have your cholesterol levels checked more often if:  Your lipid or cholesterol levels are high.  You are older than 31 years of age.  You are at high risk for heart disease. What should I know about cancer screening? Depending on your health history and family history, you may need to have cancer screening at various ages. This may include screening for:  Breast cancer.  Cervical cancer.  Colorectal cancer.  Skin  cancer.  Lung cancer. What should I know about heart disease, diabetes, and high blood pressure? Blood pressure and heart disease  High blood pressure causes heart disease and increases the risk of stroke. This is more likely to develop in people who have high blood pressure readings, are of African descent, or are overweight.  Have your blood pressure checked: ? Every 3-5 years if you are 31-31 years of age. ? Every year if you are 31 years old or older. Diabetes Have regular diabetes screenings. This checks your fasting blood sugar level. Have the screening done:  Once every three years after age 26 if you are at a normal weight and have a low risk for diabetes.  More often and at a younger age if you are overweight or have a high risk for diabetes. What should I know about preventing infection? Hepatitis B If you have a higher risk for hepatitis B, you should be screened for this virus. Talk with your health care provider to find out if you are at risk for hepatitis B infection. Hepatitis C Testing is recommended for:  Everyone born from [birth year] through 1966.  Anyone with known risk factors for hepatitis C. Sexually transmitted infections (STIs)  Get screened for STIs, including gonorrhea and chlamydia, if: ? You are sexually active and are younger than 31 years of age. ? You are older than 31 years of age and your health care provider tells you that you are at risk for this type of infection. ? Your sexual activity has changed since you were last screened, and  you are at increased risk for chlamydia or gonorrhea. Ask your health care provider if you are at risk.  Ask your health care provider about whether you are at high risk for HIV. Your health care provider may recommend a prescription medicine to help prevent HIV infection. If you choose to take medicine to prevent HIV, you should first get tested for HIV. You should then be tested every 3 months for as long as you are taking  the medicine. Pregnancy  If you are about to stop having your period (premenopausal) and you may become pregnant, seek counseling before you get pregnant.  Take 400 to 800 micrograms (mcg) of folic acid every day if you become pregnant.  Ask for birth control (contraception) if you want to prevent pregnancy. Osteoporosis and menopause Osteoporosis is a disease in which the bones lose minerals and strength with aging. This can result in bone fractures. If you are 31 years old or older, or if you are at risk for osteoporosis and fractures, ask your health care provider if you should:  Be screened for bone loss.  Take a calcium or vitamin D supplement to lower your risk of fractures.  Be given hormone replacement therapy (HRT) to treat symptoms of menopause. Follow these instructions at home: Lifestyle  Do not use any products that contain nicotine or tobacco, such as cigarettes, e-cigarettes, and chewing tobacco. If you need help quitting, ask your health care provider.  Do not use street drugs.  Do not share needles.  Ask your health care provider for help if you need support or information about quitting drugs. Alcohol use  Do not drink alcohol if: ? Your health care provider tells you not to drink. ? You are pregnant, may be pregnant, or are planning to become pregnant.  If you drink alcohol: ? Limit how much you use to 0-1 drink a day. ? Limit intake if you are breastfeeding.  Be aware of how much alcohol is in your drink. In the [place] one drink equals one 12 oz bottle of beer (355 mL), one 5 oz glass of wine (148 mL), or one 1 oz glass of hard liquor (44 mL). General instructions  Schedule regular health, dental, and eye exams.  Stay current with your vaccines.  Tell your health care provider if: ? You often feel depressed. ? You have ever been abused or do not feel safe at home. Summary  Adopting a healthy lifestyle and getting preventive care are important in  promoting health and wellness.  Follow your health care provider's instructions about healthy diet, exercising, and getting tested or screened for diseases.  Follow your health care provider's instructions on monitoring your cholesterol and blood pressure. This information is not intended to replace advice given to you by your health care provider. Make sure you discuss any questions you have with your health care provider. Document Revised: 07/15/2018 Document Reviewed: 07/15/2018 Elsevier Patient Education  2021 [employer].

## 2020-09-30 LAB — URINALYSIS, COMPLETE W/RFL CULTURE
Bacteria, UA: NONE SEEN /HPF
Bilirubin Urine: NEGATIVE
Glucose, UA: NEGATIVE
Hyaline Cast: NONE SEEN /LPF
Ketones, ur: NEGATIVE
Leukocyte Esterase: NEGATIVE
Protein, ur: NEGATIVE
Specific Gravity, Urine: 1.02 (ref 1.001–1.03)
pH: 7 (ref 5.0–8.0)

## 2020-09-30 LAB — URINE CULTURE
MICRO NUMBER:: 11574844
SPECIMEN QUALITY:: ADEQUATE

## 2020-09-30 LAB — TSH: TSH: 5.39 mIU/L — ABNORMAL HIGH

## 2020-09-30 LAB — TEST AUTHORIZATION

## 2020-09-30 LAB — THYROID PROFILE - CHCC
Free Thyroxine Index: 2 (ref 1.4–3.8)
T3 Uptake: 26 % (ref 22–35)
T4, Total: 7.7 ug/dL (ref 5.1–11.9)

## 2020-09-30 LAB — HEMOGLOBIN A1C
Hgb A1c MFr Bld: 5.5 % of total Hgb (ref <5.7)
Mean Plasma Glucose: 111 mg/dL
eAG (mmol/L): 6.2 mmol/L

## 2020-09-30 LAB — CULTURE INDICATED

## 2020-10-02 LAB — CYTOLOGY - PAP
Comment: NEGATIVE
Diagnosis: NEGATIVE
High risk HPV: NEGATIVE

## 2020-10-12 ENCOUNTER — Telehealth: Payer: Self-pay

## 2020-10-12 NOTE — Telephone Encounter (Signed)
Per DPR access note on file I left detailed message in voice mail. Asked patient to let me know if she would like referral to Endocrinologist.

## 2020-10-12 NOTE — Telephone Encounter (Signed)
This patient also has PCOS, maybe she would like a referral to endocrinology?

## 2020-10-12 NOTE — Telephone Encounter (Signed)
Patient received My Chart messages from Clarita Crane, NP Regarding elevated TSH and then normal Thyroid panel. She is concerned regarding elevated TSH and waiting a year to recheck and wanted to make sure nothing should be done sooner.

## 2020-10-17 ENCOUNTER — Telehealth: Payer: Self-pay

## 2020-10-17 NOTE — Telephone Encounter (Signed)
Call to patient. Per DPR, OK to leave message on voicemail.   Left voicemail requesting a return call to Danelly Hassinger to review benefits for scheduled Ultrasound with Marie-Lyne Lavoie, MD, FACOG, FRCSC, MA. 

## 2020-10-19 NOTE — Telephone Encounter (Signed)
Spoke with patient regarding benefits for scheduled Ultrasound. Patient acknowledges understanding of information presented. Patient requesting to reschedule appointment for a later date. Patient rescheduled for 11/28/2020 with Dr. Conley Simmonds. Encounter closed.

## 2020-10-31 ENCOUNTER — Other Ambulatory Visit: Payer: Managed Care, Other (non HMO)

## 2020-10-31 ENCOUNTER — Other Ambulatory Visit: Payer: Managed Care, Other (non HMO) | Admitting: Obstetrics & Gynecology

## 2020-11-28 ENCOUNTER — Other Ambulatory Visit: Payer: Managed Care, Other (non HMO)

## 2020-11-28 ENCOUNTER — Other Ambulatory Visit: Payer: Managed Care, Other (non HMO) | Admitting: Obstetrics and Gynecology

## 2020-11-28 DIAGNOSIS — Z0289 Encounter for other administrative examinations: Secondary | ICD-10-CM

## 2020-11-28 NOTE — Progress Notes (Deleted)
GYNECOLOGY  VISIT   HPI: 30 y.o.   Married  [place]  female   [id number] with No LMP recorded. (Menstrual status: Irregular Periods).   here for pelvic ultrasound.     GYNECOLOGIC HISTORY: No LMP recorded. (Menstrual status: Irregular Periods). Contraception:  none Menopausal hormone therapy:  none Last mammogram: n/a Last pap smear: 09-28-20 Neg:Neg HR HPV, 06-25-17 Neg, 01-23-16 Neg        OB History    Gravida  1   Para      Term      Preterm      AB  1   Living  0     SAB      IAB  1   Ectopic      Multiple      Live Births                 Patient Active Problem List   Diagnosis Date Noted  . Irregular periods/menstrual cycles 06/25/2017  . PCOS (polycystic ovarian syndrome) 06/25/2017  . CIN II (cervical intraepithelial neoplasia II)   . LGSIL (low grade squamous intraepithelial dysplasia)     Past Medical History:  Diagnosis Date  . Anxiety   . Chlamydia 04/21/08   positive test  . CIN II (cervical intraepithelial neoplasia II)   . Infertility, female   . LGSIL (low grade squamous intraepithelial dysplasia)    ON [place]TTE, TREATED WITH ALDARA  . PCOS (polycystic ovarian syndrome)   . Substance abuse (HCC)    chlamydia treated yrs ago    Past Surgical History:  Procedure Laterality Date  . CERVICAL BIOPSY  W/ LOOP ELECTRODE EXCISION  08/[id number]  . FINGER SURGERY     pinky  . THERAPEUTIC ABORTION      Current Outpatient Medications  Medication Sig Dispense Refill  . COLLAGEN PO Take by mouth. (Patient not taking: Reported on 09/28/2020)    . IBUPROFEN PO Take by mouth.    . medroxyPROGESTERone (PROVERA) 10 MG tablet Take 1 tablet (10 mg total) by mouth daily. 10 tablet 3  . Multiple Vitamin (MULTIVITAMIN PO) Take by mouth. (Patient not taking: Reported on 09/28/2020)    . Omega-3 Fatty Acids (OMEGA 3 PO) Take by mouth. (Patient not taking: Reported on 09/28/2020)    . Probiotic Product (PROBIOTIC PO) Take by mouth. (Patient not  taking: Reported on 09/28/2020)     No current facility-administered medications for this visit.     ALLERGIES: Patient has no known allergies.  Family History  Problem Relation Age of Onset  . Hypertension Mother     Social History   Socioeconomic History  . Marital status: Married    Spouse name: Not on file  . Number of children: Not on file  . Years of education: Not on file  . Highest education level: Not on file  Occupational History  . Not on file  Tobacco Use  . Smoking status: Current Some Day Smoker    Types: Cigarettes  . Smokeless tobacco: Never Used  Vaping Use  . Vaping Use: Never used  Substance and Sexual Activity  . Alcohol use: Not Currently    Alcohol/week: 0.0 standard drinks  . Drug use: No  . Sexual activity: Yes    Partners: Male    Birth control/protection: None  Other Topics Concern  . Not on file  Social History Narrative  . Not on file   Social Determinants of Health   Financial Resource Strain: Not on file  Food Insecurity: Not on file  Transportation Needs: Not on file  Physical Activity: Not on file  Stress: Not on file  Social Connections: Not on file  Intimate Partner Violence: Not on file    Review of Systems  PHYSICAL EXAMINATION:    There were no vitals taken for this visit.    General appearance: alert, cooperative and appears stated age Head: Normocephalic, without obvious abnormality, atraumatic Neck: no adenopathy, supple, symmetrical, trachea midline and thyroid normal to inspection and palpation Lungs: clear to auscultation bilaterally Breasts: normal appearance, no masses or tenderness, No nipple retraction or dimpling, No nipple discharge or bleeding, No axillary or supraclavicular adenopathy Heart: regular rate and rhythm Abdomen: soft, non-tender, no masses,  no organomegaly Extremities: extremities normal, atraumatic, no cyanosis or edema Skin: Skin color, texture, turgor normal. No rashes or lesions Lymph  nodes: Cervical, supraclavicular, and axillary nodes normal. No abnormal inguinal nodes palpated Neurologic: Grossly normal  Pelvic: External genitalia:  no lesions              Urethra:  normal appearing urethra with no masses, tenderness or lesions              Bartholins and Skenes: normal                 Vagina: normal appearing vagina with normal color and discharge, no lesions              Cervix: no lesions                Bimanual Exam:  Uterus:  normal size, contour, position, consistency, mobility, non-tender              Adnexa: no mass, fullness, tenderness              Rectal exam: {yes no:[id number]}.  Confirms.              Anus:  normal sphincter tone, no lesions  Chaperone was present for exam.  ASSESSMENT     PLAN     An After Visit Summary was printed and given to the patient.  ______ minutes face to face time of which over 50% was spent in counseling.

## 2021-07-05 ENCOUNTER — Other Ambulatory Visit: Payer: Self-pay | Admitting: Family Medicine

## 2021-07-05 DIAGNOSIS — N6310 Unspecified lump in the right breast, unspecified quadrant: Secondary | ICD-10-CM

## 2021-07-05 DIAGNOSIS — R928 Other abnormal and inconclusive findings on diagnostic imaging of breast: Secondary | ICD-10-CM

## 2021-07-24 ENCOUNTER — Ambulatory Visit
Admission: RE | Admit: 2021-07-24 | Discharge: 2021-07-24 | Disposition: A | Discharge status: Home / Self Care | Payer: Self-pay | Source: Ambulatory Visit | Attending: Family Medicine | Admitting: Family Medicine

## 2021-07-24 ENCOUNTER — Ambulatory Visit
Admission: RE | Admit: 2021-07-24 | Discharge: 2021-07-24 | Disposition: A | Discharge status: Home / Self Care | Payer: PRIVATE HEALTH INSURANCE | Source: Ambulatory Visit | Attending: Family Medicine | Admitting: Family Medicine

## 2021-07-24 ENCOUNTER — Other Ambulatory Visit: Payer: Self-pay

## 2021-07-24 DIAGNOSIS — N6310 Unspecified lump in the right breast, unspecified quadrant: Secondary | ICD-10-CM

## 2022-03-16 IMAGING — US US BREAST*R* LIMITED INC AXILLA
1 series · 9 of 9 positions shown · non-contrast
Comparison: None.

CLINICAL DATA: 30-year-old female with a right breast palpable
lump.

EXAM:
DIGITAL DIAGNOSTIC BILATERAL MAMMOGRAM WITH TOMOSYNTHESIS AND CAD;
ULTRASOUND RIGHT BREAST LIMITED
TECHNIQUE: Bilateral digital diagnostic mammography and breast tomosynthesis
was performed. The images were evaluated with computer-aided
detection.; Targeted ultrasound examination of the right breast was
performed

[Series 1: us breast*right* limited inc axilla · 0.06mm/px · 9 of 9 slices shown]
[im 1/9]
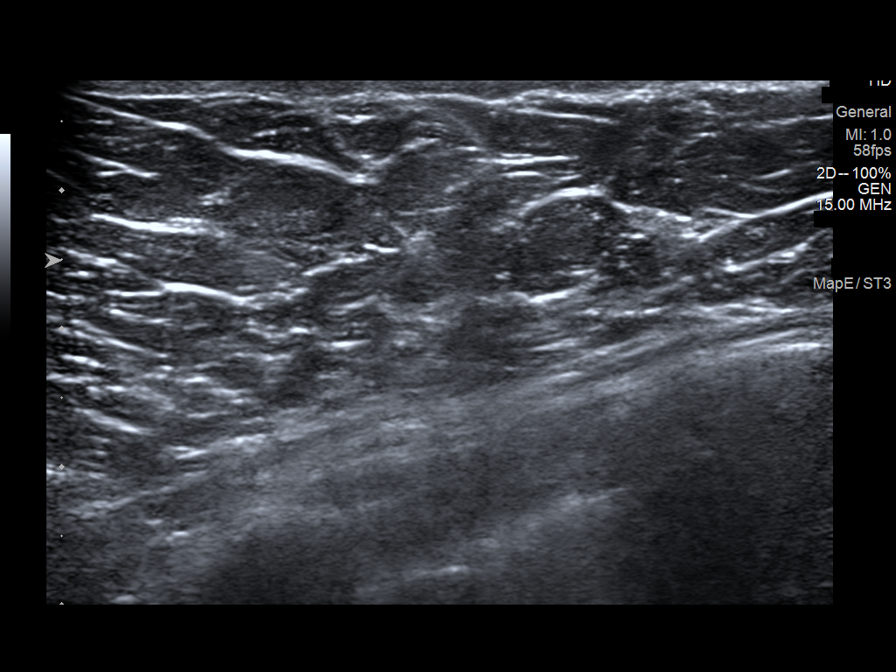
[im 2/9]
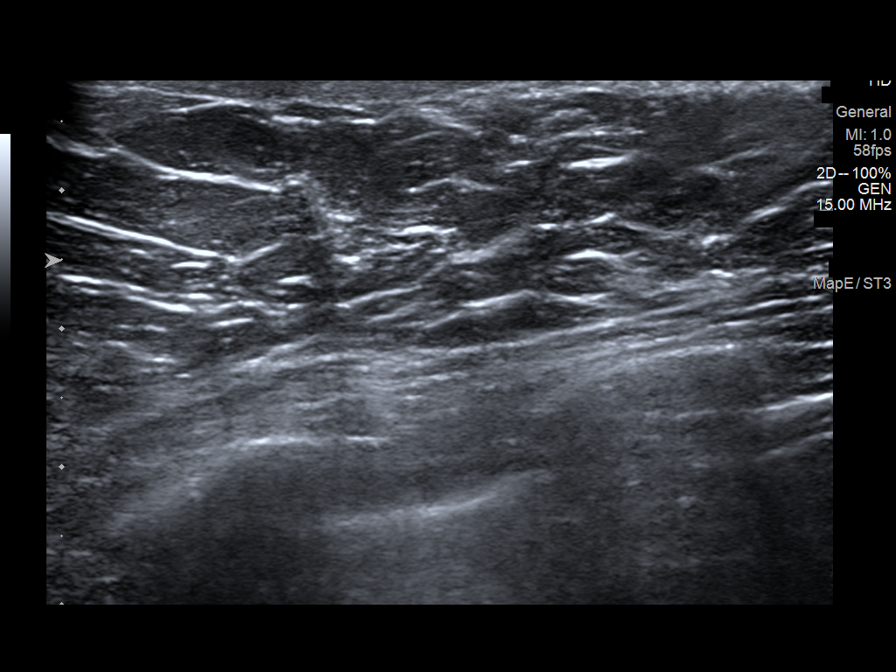
[im 3/9]
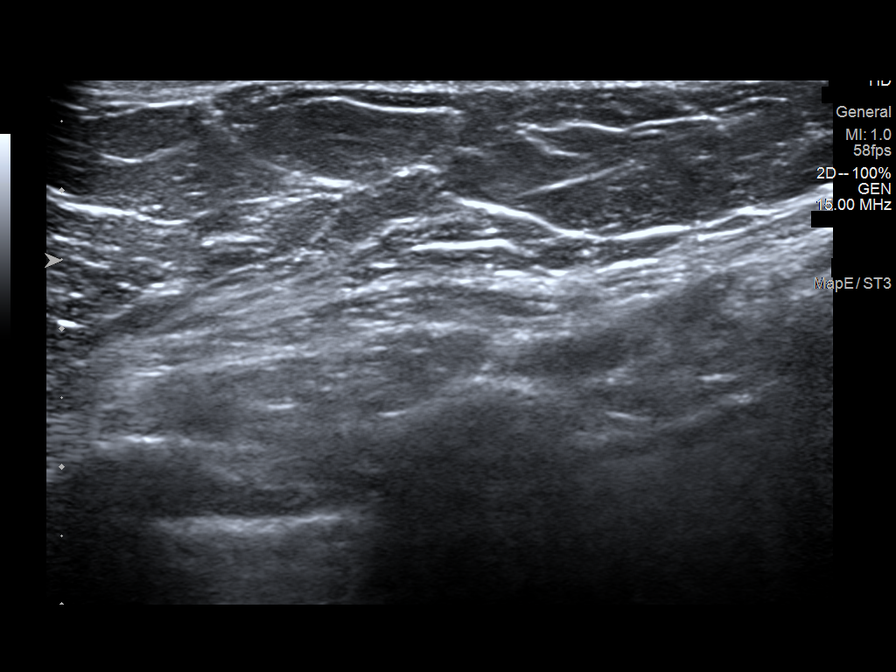
[im 4/9]
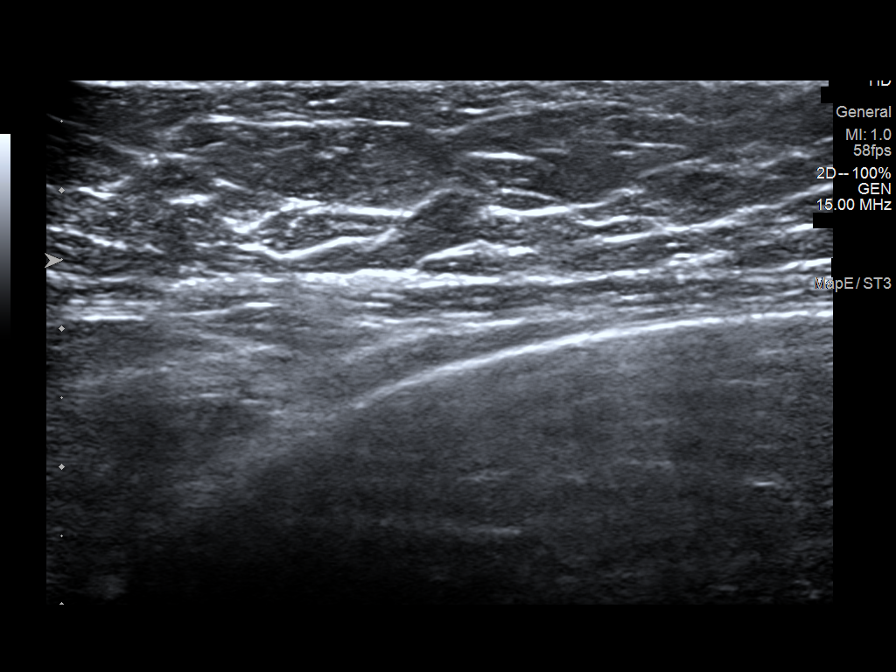
[im 5/9]
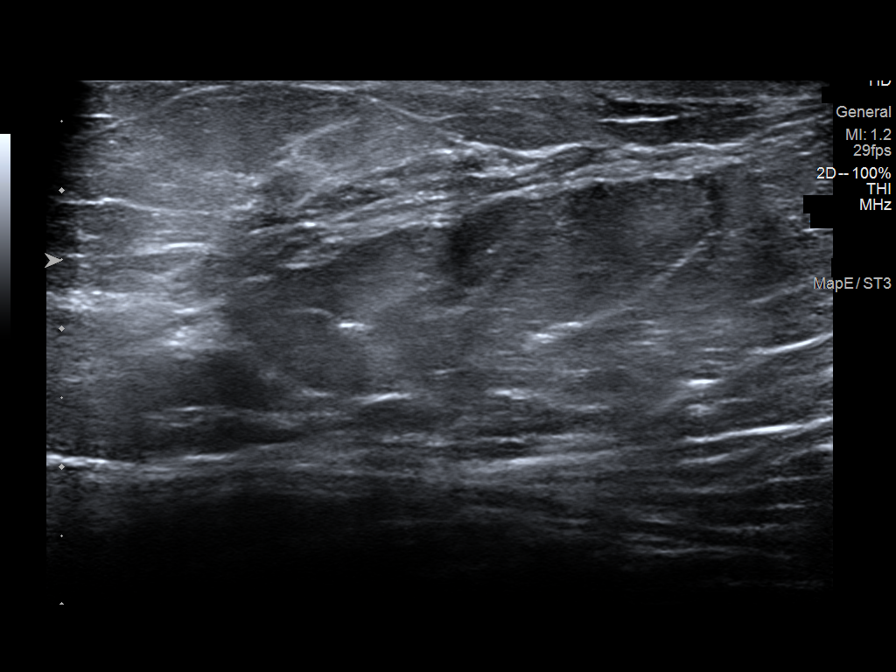
[im 6/9]
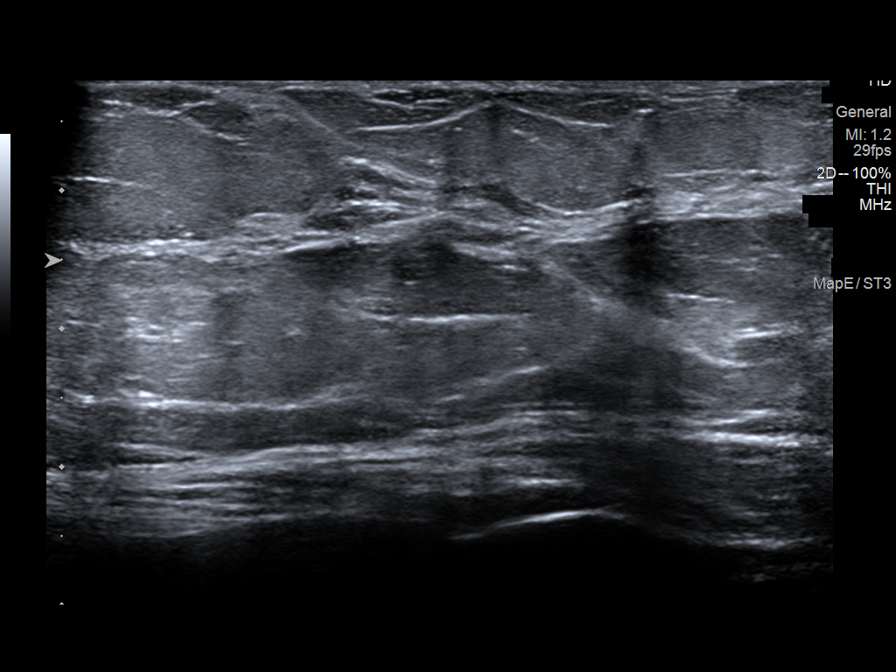
[im 7/9]
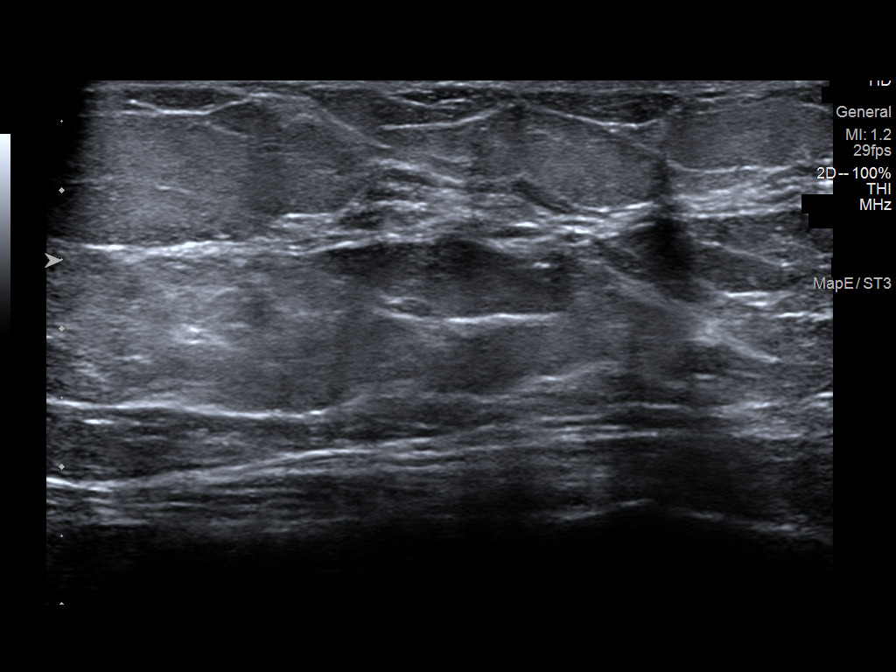
[im 8/9]
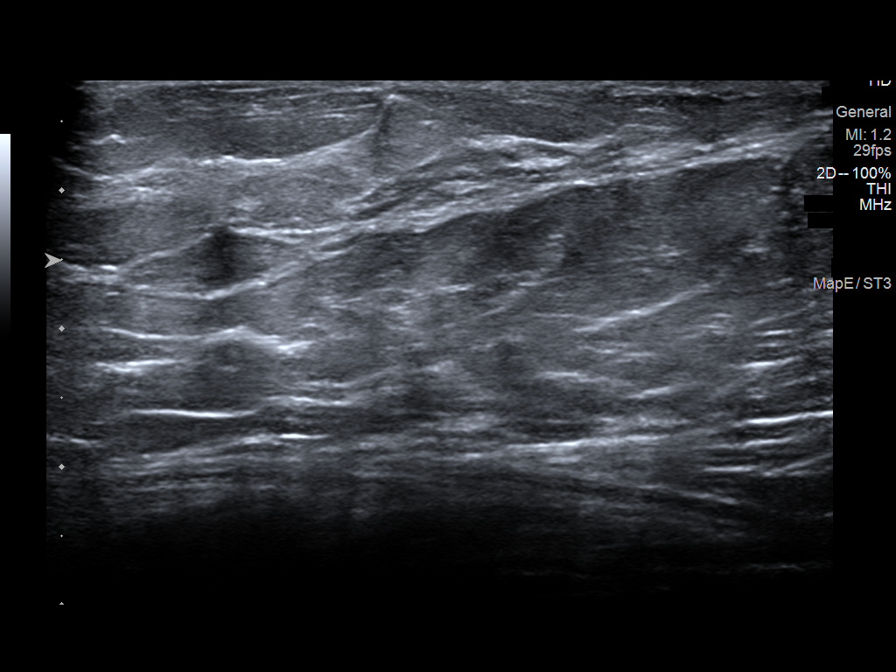
[im 9/9]
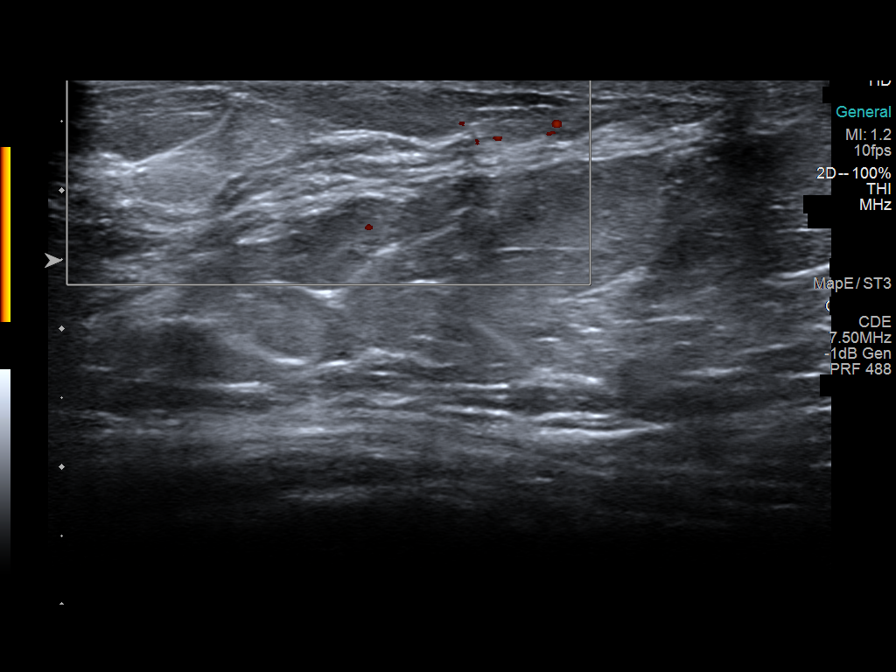

[9 of 9 positions shown; findings below may reference images not displayed]

ACR Breast Density Category b: There are scattered areas of
fibroglandular density.
FINDINGS: Radiopaque BB was placed at the site of the patient's palpable lump
in the inferior right breast. No focal or suspicious mammographic
findings are seen in the inferior right breast. Focal asymmetry in
the subareolar right breast resolves into well dispersed
fibroglandular tissue. No additional suspicious findings in either
breast.

Targeted ultrasound is performed, showing no focal or suspicious
sonographic findings in the subareolar or inferior right breast.
IMPRESSION: 1. No mammographic evidence of malignancy in either breast.
2. Unremarkable ultrasound evaluation of the right breast.

RECOMMENDATION:
1. Clinical follow-up recommended for the palpable area of concern
in the right breast. Any further workup should be based on clinical
grounds.
2. Screening mammogram at age 40 unless there are persistent or
intervening clinical concerns. (Code:BF-B-FFY)

I have discussed the findings and recommendations with the patient.
If applicable, a reminder letter will be sent to the patient
regarding the next appointment.

BI-RADS CATEGORY  1: Negative.

## 2022-03-16 IMAGING — MG DIGITAL DIAGNOSTIC BILAT W/ TOMO W/ CAD
8 of 14 series · 8 of 40 positions shown · non-contrast
Comparison: None.

CLINICAL DATA: 30-year-old female with a right breast palpable
lump.

EXAM:
DIGITAL DIAGNOSTIC BILATERAL MAMMOGRAM WITH TOMOSYNTHESIS AND CAD;
ULTRASOUND RIGHT BREAST LIMITED
TECHNIQUE: Bilateral digital diagnostic mammography and breast tomosynthesis
was performed. The images were evaluated with computer-aided
detection.; Targeted ultrasound examination of the right breast was
performed

[R MLO synth-2D (1 of 2)]
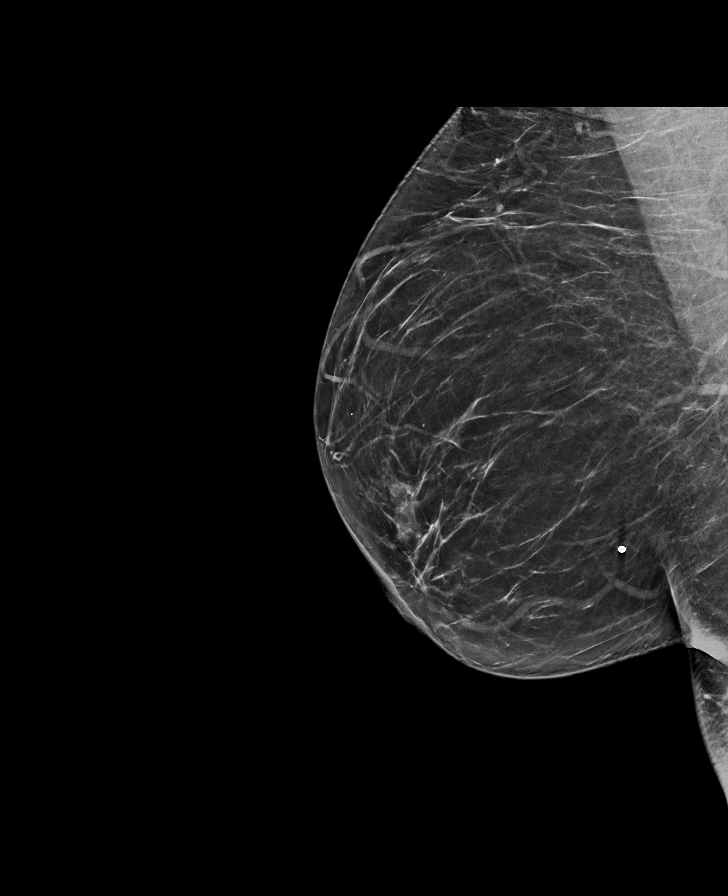

[R TAN synth-2D]
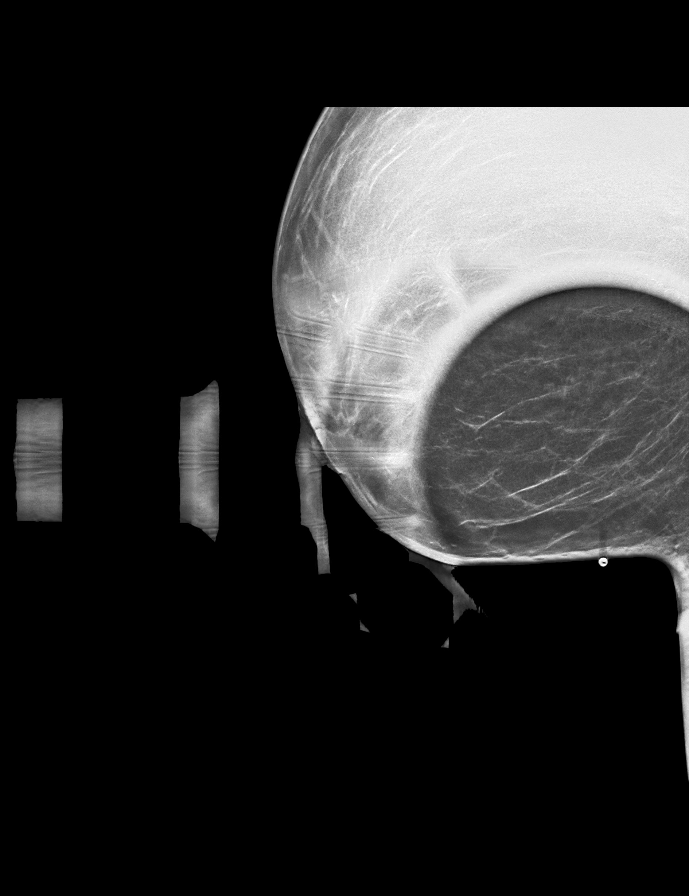

[L CC synth-2D]
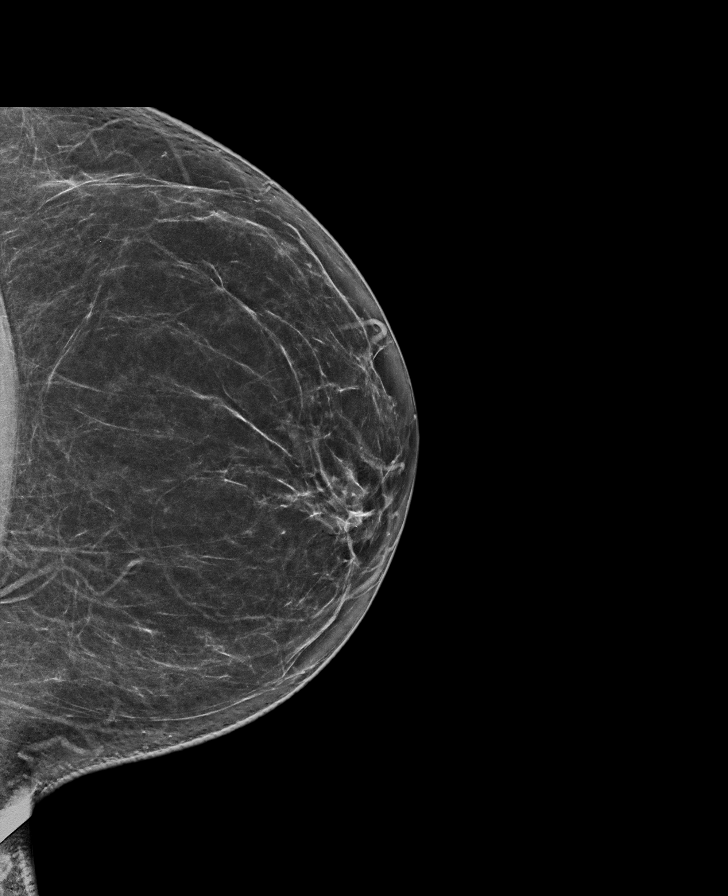

[R CC synth-2D (1 of 2)]
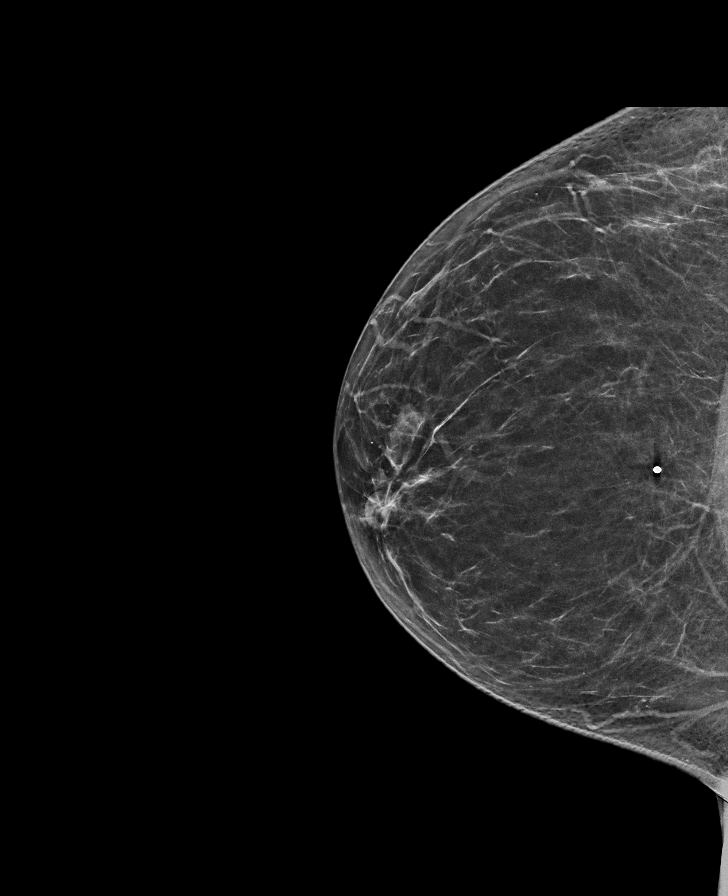

[R MLO synth-2D (2 of 2)]
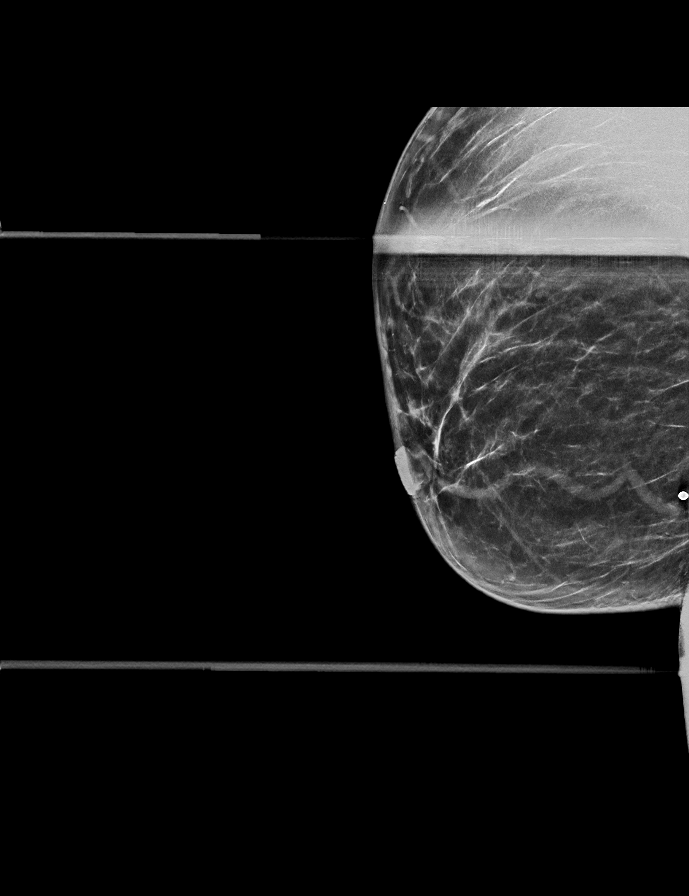

[R CC synth-2D (2 of 2)]
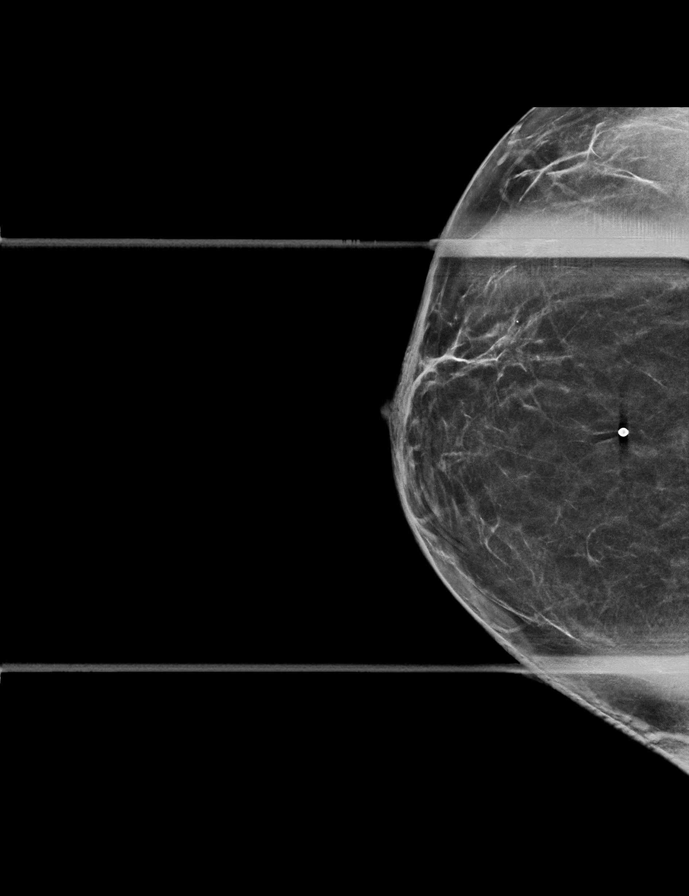

[L MLO synth-2D]
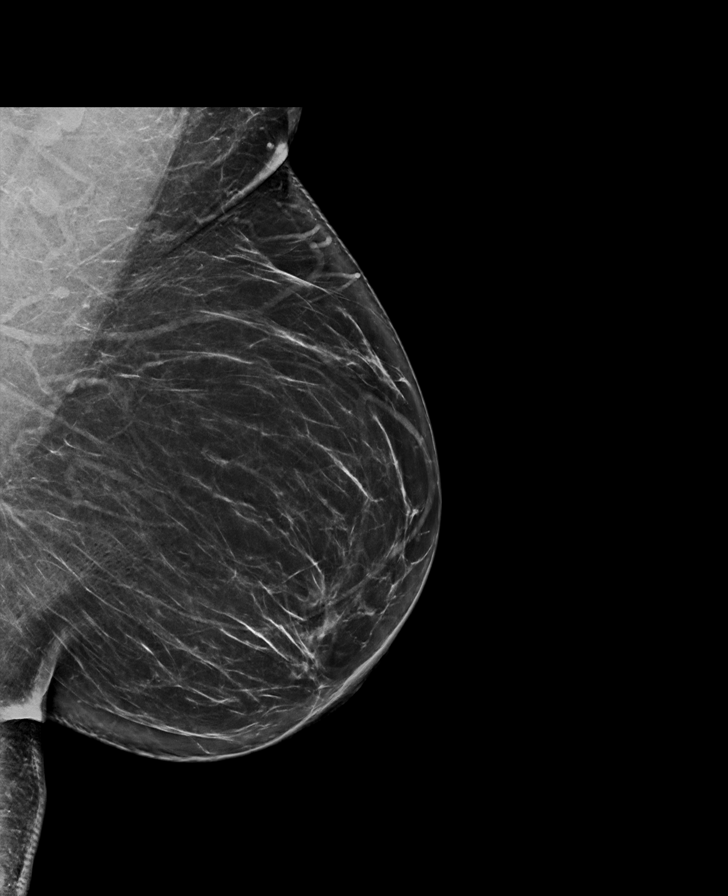

[L MLO tomo · tomo slice 43/85.0]
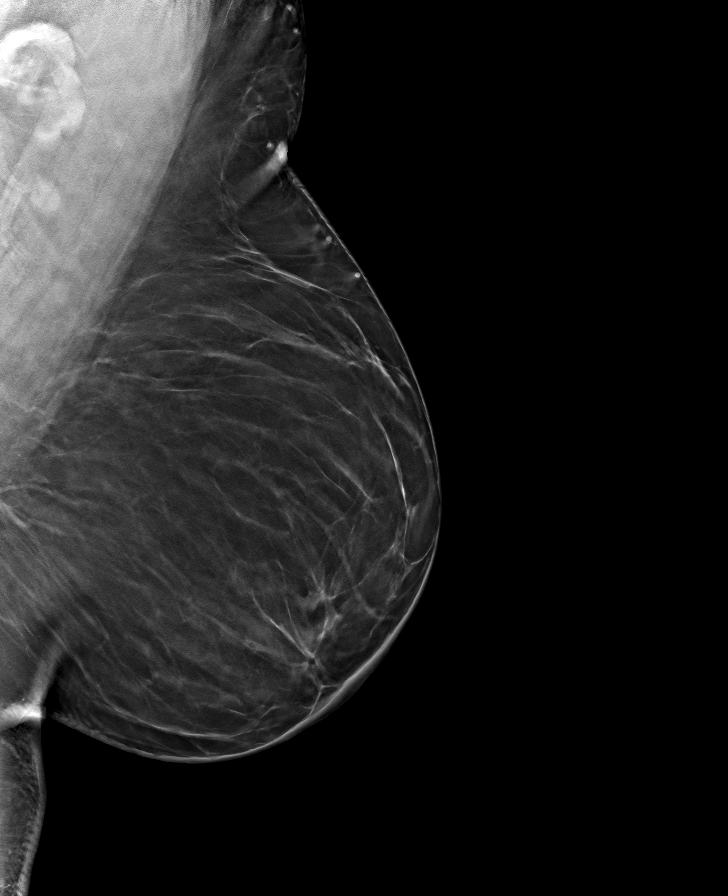

[8 of 40 positions shown; findings below may reference images not displayed]

ACR Breast Density Category b: There are scattered areas of
fibroglandular density.
FINDINGS: Radiopaque BB was placed at the site of the patient's palpable lump
in the inferior right breast. No focal or suspicious mammographic
findings are seen in the inferior right breast. Focal asymmetry in
the subareolar right breast resolves into well dispersed
fibroglandular tissue. No additional suspicious findings in either
breast.

Targeted ultrasound is performed, showing no focal or suspicious
sonographic findings in the subareolar or inferior right breast.
IMPRESSION: 1. No mammographic evidence of malignancy in either breast.
2. Unremarkable ultrasound evaluation of the right breast.

RECOMMENDATION:
1. Clinical follow-up recommended for the palpable area of concern
in the right breast. Any further workup should be based on clinical
grounds.
2. Screening mammogram at age 40 unless there are persistent or
intervening clinical concerns. (Code:[id number])

I have discussed the findings and recommendations with the patient.
If applicable, a reminder letter will be sent to the patient
regarding the next appointment.

BI-RADS CATEGORY  1: Negative.

## 2022-11-27 ENCOUNTER — Telehealth: Payer: PRIVATE HEALTH INSURANCE | Admitting: Nurse Practitioner

## 2022-11-27 DIAGNOSIS — Z3009 Encounter for other general counseling and advice on contraception: Secondary | ICD-10-CM

## 2022-11-27 NOTE — Progress Notes (Signed)
Laura Huang,  Thank yo for submitting an e-visit request. We do not prescribe birth control at this time. Please contact your primary care physician or your OBGYN  Thank you [name]

## 2022-12-17 ENCOUNTER — Telehealth: Payer: Self-pay | Admitting: Nurse Practitioner

## 2022-12-17 DIAGNOSIS — L709 Acne, unspecified: Secondary | ICD-10-CM

## 2022-12-17 MED ORDER — BENZOYL PEROXIDE-ERYTHROMYCIN 5-3 % EX GEL: Freq: Two times a day (BID) | CUTANEOUS | 0 refills | Status: DC

## 2022-12-17 NOTE — Progress Notes (Signed)
E-Visit for Acne  We are sorry that you are experiencing this issue.  Here is how we plan to help!  Based on what you shared with me it looks like you have uncomplicated acne.  Acne is a disorder of the hair follicles and oil glands (sebaceous glands). The sebaceous glands secrete oils to keep the skin moist.  When the glands get clogged, it can lead to pimples or cysts.  These cysts may become infected and leave scars. Acne is very common and normally occurs at puberty.  Acne is also inherited.  Your personal care plan consists of the following recommendations:  I recommend that you use a daily cleanser  You may try a topical exfoliator and salicylic acid scrub.  These scrubs have coarse particles that clear your pores but may also irritate your skin.  I have prescribed a topical gel with an antibiotic:  Benzoyl peroxide-erythromycin gel.  This gel should be applied to the affected areas twice a day. Be sure to read the package insert for potential side effects.  I have also prescribed one of the following additional therapies:   If excessive dryness or peeling occurs, reduce dose frequency or concentration of the topical scrubs.  If excessive stinging or burning occurs, remove the topical gel with mild soap and water and resume at a lower dose the next day.  Remember oral antibiotics and topical acne treatments may increase your sensitivity to the sun!  HOME CARE: Do not squeeze pimples because that can often lead to infections, worse acne, and scars. Use a moisturizer that contains retinoid or fruit acids that may inhibit the development of new acne lesions. Although there is not a clear link that foods can cause acne, doctors do believe that too many sweets predispose you to skin problems.  GET HELP RIGHT AWAY IF: If your acne gets worse or is not better within 10 days. If you become depressed. If you become pregnant, discontinue medications and call your OB/GYN.  MAKE SURE  YOU: Understand these instructions. Will watch your condition. Will get help right away if you are not doing well or get worse.  Thank you for choosing an e-visit.  Your e-visit answers were reviewed by a board certified advanced clinical practitioner to complete your personal care plan. Depending upon the condition, your plan could have included both over the counter or prescription medications.  Please review your pharmacy choice. Make sure the pharmacy is open so you can pick up prescription now. If there is a problem, you may contact your provider through [employer] and have the prescription routed to another pharmacy.  Your safety is important to [place]. If you have drug allergies check your prescription carefully.   For the next 24 hours you can use MyChart to ask questions about today's visit, request a non-urgent call back, or ask for a work or school excuse. You will get an email in the next two days asking about your experience. I hope that your e-visit has been valuable and will speed your recovery.   Meds ordered this encounter  Medications   benzoyl peroxide-erythromycin (BENZAMYCIN) gel    Sig: Apply topically 2 (two) times daily.    Dispense:  23.3 g    Refill:  0    I spent approximately 5 minutes reviewing the patient's history, current symptoms and coordinating their care today.

## 2022-12-23 ENCOUNTER — Telehealth: Payer: Self-pay | Admitting: Physician Assistant

## 2022-12-23 DIAGNOSIS — T3695XA Adverse effect of unspecified systemic antibiotic, initial encounter: Secondary | ICD-10-CM

## 2022-12-23 DIAGNOSIS — B379 Candidiasis, unspecified: Secondary | ICD-10-CM

## 2022-12-23 DIAGNOSIS — B9689 Other specified bacterial agents as the cause of diseases classified elsewhere: Secondary | ICD-10-CM

## 2022-12-23 DIAGNOSIS — N760 Acute vaginitis: Secondary | ICD-10-CM

## 2022-12-23 MED ORDER — METRONIDAZOLE 500 MG PO TABS
500.0000 mg | ORAL_TABLET | Freq: Two times a day (BID) | ORAL | 0 refills | Status: AC
Start: 2022-12-23 — End: 2022-12-30

## 2022-12-23 NOTE — Progress Notes (Signed)
E-Visit for Vaginal Symptoms  We are sorry that you are not feeling well. Here is how we plan to help! Based on what you shared with me it looks like you: May have a vaginosis due to bacteria  Vaginosis is an inflammation of the vagina that can result in discharge, itching and pain. The cause is usually a change in the normal balance of vaginal bacteria or an infection. Vaginosis can also result from reduced estrogen levels after menopause.  The most common causes of vaginosis are:   Bacterial vaginosis which results from an overgrowth of one on several organisms that are normally present in your vagina.   Yeast infections which are caused by a naturally occurring fungus called candida.   Vaginal atrophy (atrophic vaginosis) which results from the thinning of the vagina from reduced estrogen levels after menopause.   Trichomoniasis which is caused by a parasite and is commonly transmitted by sexual intercourse.  Factors that increase your risk of developing vaginosis include: Medications, such as antibiotics and steroids Uncontrolled diabetes Use of hygiene products such as bubble bath, vaginal spray or vaginal deodorant Douching Wearing damp or tight-fitting clothing Using an intrauterine device (IUD) for birth control Hormonal changes, such as those associated with pregnancy, birth control pills or menopause Sexual activity Having a sexually transmitted infection  Your treatment plan is Metronidazole or Flagyl 500mg twice a day for 7 days.  I have electronically sent this prescription into the pharmacy that you have chosen.  Be sure to take all of the medication as directed. Stop taking any medication if you develop a rash, tongue swelling or shortness of breath. Mothers who are breast feeding should consider pumping and discarding their breast milk while on these antibiotics. However, there is no consensus that infant exposure at these doses would be harmful.  Remember that  medication creams can weaken latex condoms. .   HOME CARE:  Good hygiene may prevent some types of vaginosis from recurring and may relieve some symptoms:  Avoid baths, hot tubs and whirlpool spas. Rinse soap from your outer genital area after a shower, and dry the area well to prevent irritation. Don't use scented or harsh soaps, such as those with deodorant or antibacterial action. Avoid irritants. These include scented tampons and pads. Wipe from front to back after using the toilet. Doing so avoids spreading fecal bacteria to your vagina.  Other things that may help prevent vaginosis include:  Don't douche. Your vagina doesn't require cleansing other than normal bathing. Repetitive douching disrupts the normal organisms that reside in the vagina and can actually increase your risk of vaginal infection. Douching won't clear up a vaginal infection. Use a latex condom. Both female and female latex condoms may help you avoid infections spread by sexual contact. Wear cotton underwear. Also wear pantyhose with a cotton crotch. If you feel comfortable without it, skip wearing underwear to bed. Yeast thrives in moist environments Your symptoms should improve in the next day or two.  GET HELP RIGHT AWAY IF:  You have pain in your lower abdomen ( pelvic area or over your ovaries) You develop nausea or vomiting You develop a fever Your discharge changes or worsens You have persistent pain with intercourse You develop shortness of breath, a rapid pulse, or you faint.  These symptoms could be signs of problems or infections that need to be evaluated by a medical provider now.  MAKE SURE YOU   Understand these instructions. Will watch your condition. Will get help right   away if you are not doing well or get worse.  Thank you for choosing an e-visit.  Your e-visit answers were reviewed by a board certified advanced clinical practitioner to complete your personal care plan. Depending upon the  condition, your plan could have included both over the counter or prescription medications.  Please review your pharmacy choice. Make sure the pharmacy is open so you can pick up prescription now. If there is a problem, you may contact your provider through MyChart messaging and have the prescription routed to another pharmacy.  Your safety is important to us. If you have drug allergies check your prescription carefully.   For the next 24 hours you can use MyChart to ask questions about today's visit, request a non-urgent call back, or ask for a work or school excuse. You will get an email in the next two days asking about your experience. I hope that your e-visit has been valuable and will speed your recovery.  I have spent 5 minutes in review of e-visit questionnaire, review and updating patient chart, medical decision making and response to patient.   Harim Bi M Sherriann Szuch, PA-C  

## 2022-12-24 MED ORDER — FLUCONAZOLE 150 MG PO TABS
150.0000 mg | ORAL_TABLET | ORAL | 0 refills | Status: DC | PRN
Start: 2022-12-24 — End: 2023-10-20

## 2022-12-24 NOTE — Addendum Note (Signed)
Addended by: Margaretann Loveless on: 12/24/2022 06:50 AM   Modules accepted: Orders

## 2023-01-24 ENCOUNTER — Other Ambulatory Visit: Payer: Self-pay | Admitting: Nurse Practitioner

## 2023-01-24 DIAGNOSIS — L709 Acne, unspecified: Secondary | ICD-10-CM

## 2023-02-13 DIAGNOSIS — Z113 Encounter for screening for infections with a predominantly sexual mode of transmission: Secondary | ICD-10-CM | POA: Diagnosis not present

## 2023-02-13 DIAGNOSIS — E282 Polycystic ovarian syndrome: Secondary | ICD-10-CM | POA: Diagnosis not present

## 2023-02-13 DIAGNOSIS — Z01419 Encounter for gynecological examination (general) (routine) without abnormal findings: Secondary | ICD-10-CM | POA: Diagnosis not present

## 2023-02-13 DIAGNOSIS — Z6834 Body mass index (BMI) 34.0-34.9, adult: Secondary | ICD-10-CM | POA: Diagnosis not present

## 2023-02-13 DIAGNOSIS — Z309 Encounter for contraceptive management, unspecified: Secondary | ICD-10-CM | POA: Diagnosis not present

## 2023-02-13 DIAGNOSIS — N898 Other specified noninflammatory disorders of vagina: Secondary | ICD-10-CM | POA: Diagnosis not present

## 2023-04-20 DIAGNOSIS — Z111 Encounter for screening for respiratory tuberculosis: Secondary | ICD-10-CM | POA: Diagnosis not present

## 2023-04-25 DIAGNOSIS — Z111 Encounter for screening for respiratory tuberculosis: Secondary | ICD-10-CM | POA: Diagnosis not present

## 2023-04-28 DIAGNOSIS — Z111 Encounter for screening for respiratory tuberculosis: Secondary | ICD-10-CM | POA: Diagnosis not present

## 2023-05-05 DIAGNOSIS — Z111 Encounter for screening for respiratory tuberculosis: Secondary | ICD-10-CM | POA: Diagnosis not present

## 2023-07-26 ENCOUNTER — Telehealth: Payer: Self-pay | Admitting: Physician Assistant

## 2023-07-26 DIAGNOSIS — N939 Abnormal uterine and vaginal bleeding, unspecified: Secondary | ICD-10-CM

## 2023-07-26 NOTE — Progress Notes (Signed)
Because of your ongoing spotting, I feel your condition warrants further evaluation and I recommend that you be seen in a face to face visit.   NOTE: There will be NO CHARGE for this eVisit   If you are having a true medical emergency please call 911.      For an urgent face to face visit, [place] has eight urgent care centers for your convenience:   NEW!! [facility] Center at [facility] Get Driving Directions [phone] [address], [address] [place], [address]    [facility] Health Urgent Care Center at [facility] Get Driving Directions [phone] [address] [place], [place] [address]   [facility] Health Urgent Care Center [facility]) Get Driving Directions [phone] [address] [place], [place] [address]  [facility] Health Urgent Care Center [facility]) Get Driving Directions [phone] [address] [place],  [place]  [address]  [facility] Health Urgent Care Center [facility] - at [employer]  [phone] [place],  [place] [address]   [facility] Health Urgent Care at [facility] Get Driving Directions [phone] [address] [place], [place] [address]   [facility] Health Urgent Care at [facility] Get Driving Directions  [phone] [address] [place], [place] [address]   [facility] Health Urgent Care at [facility] Directions [phone] [address], [address] [place], [place] [address]  Your MyChart E-visit questionnaire answers were reviewed by a board certified advanced clinical practitioner to complete your personal care plan based on your specific symptoms.  Thank you for using e-Visits.

## 2023-08-10 ENCOUNTER — Telehealth: Payer: Self-pay | Admitting: Physician Assistant

## 2023-08-10 DIAGNOSIS — L709 Acne, unspecified: Secondary | ICD-10-CM

## 2023-08-10 MED ORDER — BENZOYL PEROXIDE-ERYTHROMYCIN 5-3 % EX GEL: Freq: Two times a day (BID) | CUTANEOUS | 0 refills | Status: AC

## 2023-08-10 NOTE — Progress Notes (Signed)
 E-Visit for Acne   We are sorry that you are experiencing this issue.  Here is how we plan to help!  Based on what you shared with me it looks like you have uncomplicated acne.  Acne is a disorder of the hair follicles and oil glands (sebaceous glands). The sebaceous glands secrete oils to keep the skin moist.  When the glands get clogged, it can lead to pimples or cysts.  These cysts may become infected and leave scars. Acne is very common and normally occurs at puberty.  Acne is also inherited.  Your personal care plan consists of the following recommendations:  I recommend that you use a daily cleanser  You may try a topical exfoliator and salicylic acid scrub.  These scrubs have coarse particles that clear your pores but may also irritate your skin.  I have prescribed a topical gel with an antibiotic:  Benzoyl peroxide -erythromycin  gel.  This gel should be applied to the affected areas twice a day. Be sure to read the package insert for potential side effects.   If excessive dryness or peeling occurs, reduce dose frequency or concentration of the topical scrubs.  If excessive stinging or burning occurs, remove the topical gel with mild soap and water and resume at a lower dose the next day.  Remember oral antibiotics and topical acne treatments may increase your sensitivity to the sun!  HOME CARE: Do not squeeze pimples because that can often lead to infections, worse acne, and scars. Use a moisturizer that contains retinoid or fruit acids that may inhibit the development of new acne lesions. Although there is not a clear link that foods can cause acne, doctors do believe that too many sweets predispose you to skin problems.  GET HELP RIGHT AWAY IF: If your acne gets worse or is not better within 10 days. If you become depressed. If you become pregnant, discontinue medications and call your OB/GYN.  MAKE SURE YOU: Understand these instructions. Will watch your condition. Will get  help right away if you are not doing well or get worse.  Thank you for choosing an e-visit.  Your e-visit answers were reviewed by a board certified advanced clinical practitioner to complete your personal care plan. Depending upon the condition, your plan could have included both over the counter or prescription medications.  Please review your pharmacy choice. Make sure the pharmacy is open so you can pick up prescription now. If there is a problem, you may contact your provider through [employer] and have the prescription routed to another pharmacy.  Your safety is important to us . If you have drug allergies check your prescription carefully.   For the next 24 hours you can use MyChart to ask questions about today's visit, request a non-urgent call back, or ask for a work or school excuse. You will get an email in the next two days asking about your experience. I hope that your e-visit has been valuable and will speed your recovery.

## 2023-08-10 NOTE — Progress Notes (Signed)
 I have spent 5 minutes in review of e-visit questionnaire, review and updating patient chart, medical decision making and response to patient.   Laure Kidney, PA-C

## 2023-10-20 ENCOUNTER — Telehealth: Payer: Self-pay | Admitting: Physician Assistant

## 2023-10-20 DIAGNOSIS — B3731 Acute candidiasis of vulva and vagina: Secondary | ICD-10-CM | POA: Diagnosis not present

## 2023-10-20 MED ORDER — FLUCONAZOLE 150 MG PO TABS
150.0000 mg | ORAL_TABLET | Freq: Once | ORAL | 0 refills | Status: DC
Start: 2023-10-20 — End: 2023-10-22

## 2023-10-20 NOTE — Progress Notes (Signed)

## 2023-10-20 NOTE — Progress Notes (Signed)
 I have spent 5 minutes in review of e-visit questionnaire, review and updating patient chart, medical decision making and response to patient.   Piedad Climes, PA-C

## 2023-10-22 MED ORDER — FLUCONAZOLE 150 MG PO TABS
150.0000 mg | ORAL_TABLET | Freq: Once | ORAL | 0 refills | Status: AC
Start: 2023-10-22 — End: 2023-10-22

## 2023-10-22 NOTE — Addendum Note (Signed)
 Addended by: Waldon Merl on: 10/22/2023 05:17 PM   Modules accepted: Orders

## 2024-05-20 ENCOUNTER — Telehealth: Admitting: Physician Assistant

## 2024-05-20 DIAGNOSIS — B3731 Acute candidiasis of vulva and vagina: Secondary | ICD-10-CM | POA: Diagnosis not present

## 2024-05-21 NOTE — Progress Notes (Signed)
